# Patient Record
Sex: Female | Born: 1965
Health system: Southern US, Community
[De-identification: ages and names within clinical notes are randomized; demographics above are authoritative.]

## PROBLEM LIST (undated history)

## (undated) DIAGNOSIS — N6320 Unspecified lump in the left breast, unspecified quadrant: Secondary | ICD-10-CM

## (undated) DIAGNOSIS — I10 Essential (primary) hypertension: Secondary | ICD-10-CM

## (undated) DIAGNOSIS — F329 Major depressive disorder, single episode, unspecified: Secondary | ICD-10-CM

## (undated) DIAGNOSIS — M720 Palmar fascial fibromatosis [Dupuytren]: Secondary | ICD-10-CM

## (undated) DIAGNOSIS — N951 Menopausal and female climacteric states: Secondary | ICD-10-CM

## (undated) DIAGNOSIS — F32A Depression, unspecified: Secondary | ICD-10-CM

## (undated) DIAGNOSIS — F419 Anxiety disorder, unspecified: Secondary | ICD-10-CM

## (undated) DIAGNOSIS — D509 Iron deficiency anemia, unspecified: Secondary | ICD-10-CM

## (undated) HISTORY — DX: Menopausal and female climacteric states: N95.1

## (undated) HISTORY — PX: UPPER GASTROINTESTINAL ENDOSCOPY: SHX188

## (undated) HISTORY — DX: Depression, unspecified: F32.A

## (undated) HISTORY — DX: Anxiety disorder, unspecified: F41.9

## (undated) HISTORY — DX: Unspecified lump in the left breast, unspecified quadrant: N63.20

## (undated) HISTORY — DX: Essential (primary) hypertension: I10

## (undated) HISTORY — DX: Palmar fascial fibromatosis (dupuytren): M72.0

## (undated) HISTORY — PX: TUBAL LIGATION: SHX77

## (undated) HISTORY — PX: REFRACTIVE SURGERY: SHX103

## (undated) HISTORY — DX: Major depressive disorder, single episode, unspecified: F32.9

---
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
## 1995-05-16 HISTORY — PX: HAND TENDON SURGERY: SHX663

## 2006-05-15 HISTORY — PX: BREAST BIOPSY: SHX20

## 2013-05-15 HISTORY — PX: COLONOSCOPY: SHX174

## 2015-02-13 HISTORY — PX: HERNIA REPAIR: SHX51

## 2018-01-10 ENCOUNTER — Encounter: Payer: Self-pay | Admitting: Physician Assistant

## 2018-01-10 ENCOUNTER — Ambulatory Visit (INDEPENDENT_AMBULATORY_CARE_PROVIDER_SITE_OTHER): Payer: BLUE CROSS/BLUE SHIELD | Admitting: Physician Assistant

## 2018-01-10 VITALS — BP 135/79 | HR 84

## 2018-01-10 DIAGNOSIS — Z1211 Encounter for screening for malignant neoplasm of colon: Secondary | ICD-10-CM

## 2018-01-10 DIAGNOSIS — Z1231 Encounter for screening mammogram for malignant neoplasm of breast: Secondary | ICD-10-CM

## 2018-01-10 DIAGNOSIS — Z800 Family history of malignant neoplasm of digestive organs: Secondary | ICD-10-CM | POA: Diagnosis not present

## 2018-01-10 DIAGNOSIS — Z7689 Persons encountering health services in other specified circumstances: Secondary | ICD-10-CM

## 2018-01-10 DIAGNOSIS — F341 Dysthymic disorder: Secondary | ICD-10-CM

## 2018-01-10 DIAGNOSIS — M720 Palmar fascial fibromatosis [Dupuytren]: Secondary | ICD-10-CM | POA: Insufficient documentation

## 2018-01-10 DIAGNOSIS — Z23 Encounter for immunization: Secondary | ICD-10-CM

## 2018-01-10 MED ORDER — VENLAFAXINE HCL 75 MG PO TABS: 75.0000 mg | ORAL_TABLET | Freq: Three times a day (TID) | ORAL | 1 refills | Status: DC

## 2018-01-10 NOTE — Progress Notes (Signed)
HPI:                                                                Charlene Bell is a 52 y.o. female who presents to [facility]: Primary Care Sports Medicine today to establish care  Current concerns: medication refill  Recently relocated from [facility] Working for [employer].  Currently taking Effexor 75 mg tid since 2008 for depression. Reports she does have some cyclical mood changes and anxiety with her menses. Menses have become more irregular, Q2-39months, duration fluctuates between 1-5 days, right now cycle is heavier than normal    Depression screen [facility] 2/9 01/10/2018  Decreased Interest 2  Down, Depressed, Hopeless 2  PHQ - 2 Score 4  Altered sleeping 1  Tired, decreased energy 2  Change in appetite 0  Feeling bad or failure about yourself  2  Trouble concentrating 0  Moving slowly or fidgety/restless 0  Suicidal thoughts 0  PHQ-9 Score 9  Difficult doing work/chores Somewhat difficult    No flowsheet data found.    Past Medical History:  Diagnosis Date  . Anxiety   . Depression   . Dupuytren's contracture of right hand   . Left breast mass   . Perimenopause    Past Surgical History:  Procedure Laterality Date  . BREAST BIOPSY  2008  . COLONOSCOPY  2015   normal per patient  . HAND TENDON SURGERY Right 1997  . HERNIA REPAIR  02/2015  . TUBAL LIGATION     Social History   Tobacco Use  . Smoking status: Never Smoker  . Smokeless tobacco: Never Used  Substance Use Topics  . Alcohol use: Yes    Alcohol/week: 6.0 standard drinks    Types: 6 Standard drinks or equivalent per week   family history includes Colon cancer in her father; Dementia in her sister; Heart attack in her mother; Heart failure in her father.    ROS: Review of Systems  Constitutional: Positive for diaphoresis.  Genitourinary:       + abnormal periods  Psychiatric/Behavioral: Positive for depression.     Medications: Current Outpatient Medications   Medication Sig Dispense Refill  . venlafaxine (EFFEXOR) 75 MG tablet Take 1 tablet (75 mg total) by mouth 3 (three) times daily. 270 tablet 1   No current facility-administered medications for this visit.    Not on File     Objective:  BP 135/79   Pulse 84   LMP 01/06/2018 (Exact Date)  Gen:  alert, not ill-appearing, no distress, appropriate for age HEENT: head normocephalic without obvious abnormality, conjunctiva and cornea clear, trachea midline Pulm: Normal work of breathing, normal phonation Neuro: alert and oriented x 3, no tremor MSK: extremities atraumatic, normal gait and station; Contracture of right 5th digit Skin: intact, no rashes on exposed skin, no jaundice, no cyanosis Psych: well-groomed, cooperative, good eye contact, depressed mood, affect mood-congruent, speech is articulate, and thought processes clear and goal-directed    No results found for this or any previous visit (from the past 72 hour(s)). No results found.    Assessment and Plan: 52 y.o. female with   .Charlene Bell was seen today for establish care.  Diagnoses and all orders for this visit:  Encounter to establish care  Family  history of colon cancer in father -     Ambulatory referral to Gastroenterology  Colon cancer screening -     Ambulatory referral to Gastroenterology  Breast cancer screening by mammogram -     MM 3D SCREEN BREAST BILATERAL; Future  Needs flu shot -     Flu Vaccine QUAD 36+ mos IM  Dupuytren's contracture of right hand  Dysthymia  Other orders -     venlafaxine (EFFEXOR) 75 MG tablet; Take 1 tablet (75 mg total) by mouth 3 (three) times daily.   - Personally reviewed PMH, PSH, PFH, medications, allergies, HM - Age-appropriate cancer screening: Pap smear overdue, patient will schedule f/u; mammogram due, ordered today; colonoscopy Q5y, referral placed - Influenza given today - Tdap UTD per patient - Encouraged to schedule annual CPE w/fasting labs at earliest  convenience   Dysthymia - PHQ9=9, no acute safety issues - would like to continue Effexor at current dose - follow-up Q726months  Patient education and anticipatory guidance given Patient agrees with treatment plan Follow-up for CPE w/Pap and fasting labs or sooner as needed if symptoms worsen or fail to improve  Levonne Hubertharley E. Marieanne Marxen PA-C

## 2018-01-16 ENCOUNTER — Ambulatory Visit (INDEPENDENT_AMBULATORY_CARE_PROVIDER_SITE_OTHER): Payer: BLUE CROSS/BLUE SHIELD

## 2018-01-16 DIAGNOSIS — Z1231 Encounter for screening mammogram for malignant neoplasm of breast: Secondary | ICD-10-CM

## 2018-01-22 DIAGNOSIS — M79644 Pain in right finger(s): Secondary | ICD-10-CM | POA: Diagnosis not present

## 2018-01-22 DIAGNOSIS — M25641 Stiffness of right hand, not elsewhere classified: Secondary | ICD-10-CM | POA: Diagnosis not present

## 2018-01-23 ENCOUNTER — Encounter: Payer: BLUE CROSS/BLUE SHIELD | Admitting: Physician Assistant

## 2018-01-25 DIAGNOSIS — M25641 Stiffness of right hand, not elsewhere classified: Secondary | ICD-10-CM | POA: Diagnosis not present

## 2018-01-25 DIAGNOSIS — M79644 Pain in right finger(s): Secondary | ICD-10-CM | POA: Diagnosis not present

## 2018-01-28 ENCOUNTER — Telehealth: Payer: Self-pay | Admitting: Gastroenterology

## 2018-01-28 NOTE — Telephone Encounter (Signed)
Received referral for patient to have next colon here. Patient faxed colon reports from 2010 and 2015. Patient not requesting specific doctor. DOD for referral date 8.29.19 is Dr.Stark. Records sent to desk for review.

## 2018-01-29 ENCOUNTER — Other Ambulatory Visit (HOSPITAL_COMMUNITY)
Admission: RE | Admit: 2018-01-29 | Discharge: 2018-01-29 | Disposition: A | Discharge status: Home / Self Care | Payer: BLUE CROSS/BLUE SHIELD | Source: Ambulatory Visit | Attending: Physician Assistant | Admitting: Physician Assistant

## 2018-01-29 ENCOUNTER — Ambulatory Visit (INDEPENDENT_AMBULATORY_CARE_PROVIDER_SITE_OTHER): Payer: BLUE CROSS/BLUE SHIELD | Admitting: Physician Assistant

## 2018-01-29 ENCOUNTER — Encounter: Payer: Self-pay | Admitting: Physician Assistant

## 2018-01-29 VITALS — BP 138/86 | HR 82 | Resp 16 | Ht 63.0 in | Wt 141.0 lb

## 2018-01-29 DIAGNOSIS — Z0000 Encounter for general adult medical examination without abnormal findings: Secondary | ICD-10-CM

## 2018-01-29 DIAGNOSIS — Z124 Encounter for screening for malignant neoplasm of cervix: Secondary | ICD-10-CM | POA: Insufficient documentation

## 2018-01-29 DIAGNOSIS — N951 Menopausal and female climacteric states: Secondary | ICD-10-CM | POA: Diagnosis not present

## 2018-01-29 DIAGNOSIS — R030 Elevated blood-pressure reading, without diagnosis of hypertension: Secondary | ICD-10-CM

## 2018-01-29 DIAGNOSIS — M25641 Stiffness of right hand, not elsewhere classified: Secondary | ICD-10-CM | POA: Diagnosis not present

## 2018-01-29 DIAGNOSIS — Z13220 Encounter for screening for lipoid disorders: Secondary | ICD-10-CM

## 2018-01-29 DIAGNOSIS — M79644 Pain in right finger(s): Secondary | ICD-10-CM | POA: Diagnosis not present

## 2018-01-29 DIAGNOSIS — Z130 Encounter for screening for diseases of the blood and blood-forming organs and certain disorders involving the immune mechanism: Secondary | ICD-10-CM

## 2018-01-29 DIAGNOSIS — Z131 Encounter for screening for diabetes mellitus: Secondary | ICD-10-CM

## 2018-01-29 NOTE — Progress Notes (Signed)
HPI:                                                                Charlene Bell is a 52 y.o. female who presents to [facility]: Primary Care Sports Medicine today for annual physical exam  Current Concerns include: none    GYN/Sexual Health  Obstetrics: [id number]  Menstrual status: perimenopausal  LMP: 2 weeks ago  Menses: irregular  Last pap smear: 2015, normal per patient  History of abnormal pap smears: ASCUS more than 10 years ago  Sexually active: yes  Current contraception: none needed  History of STI: none    Office Visit from 01/10/2018 in [facility] [place]  PHQ-2 Total Score  4       Health Maintenance Health Maintenance  Topic Date Due  . HIV Screening  03/09/1981  . PAP SMEAR  03/09/1996  . COLONOSCOPY  03/16/2019  . MAMMOGRAM  01/17/2020  . TETANUS/TDAP  11/09/2024  . INFLUENZA VACCINE  Completed    Past Medical History:  Diagnosis Date  . Anxiety   . Depression   . Dupuytren's contracture of right hand   . Left breast mass   . Perimenopause    Past Surgical History:  Procedure Laterality Date  . BREAST BIOPSY Left 2008   all findings benign   . COLONOSCOPY  2015   normal per patient  . HAND TENDON SURGERY Right 1997  . HERNIA REPAIR  02/2015  . REFRACTIVE SURGERY    . TUBAL LIGATION     Social History   Tobacco Use  . Smoking status: Never Smoker  . Smokeless tobacco: Never Used  Substance Use Topics  . Alcohol use: Yes    Alcohol/week: 6.0 standard drinks    Types: 6 Standard drinks or equivalent per week   family history includes Colon cancer in her father; Dementia in her sister; Heart attack in her mother; Heart failure in her father.  ROS: negative except as noted in the HPI  Medications: Current Outpatient Medications  Medication Sig Dispense Refill  . Calcium Carbonate-Vitamin D3 600-400 MG-UNIT TABS Take 1 tablet by mouth 2 (two) times daily.    Marland Kitchen venlafaxine  (EFFEXOR) 75 MG tablet Take 1 tablet (75 mg total) by mouth 3 (three) times daily. 270 tablet 1   No current facility-administered medications for this visit.    Not on File     Objective:  BP 138/86   Pulse 82   Resp 16   Ht 5\' 3"  (1.6 m)   Wt 141 lb (64 kg)   LMP 01/06/2018 (Approximate)   BMI 24.98 kg/m  General Appearance:  Alert, cooperative, no distress, appropriate for age                            Head:  Normocephalic, without obvious abnormality                             Eyes:  PERRL, EOM's intact, conjunctiva and cornea clear  Ears:  TM pearly gray color and semitransparent, external ear canals normal, both ears                            Nose:  Nares symmetrical                          Throat:  Lips, tongue, and mucosa are moist, pink, and intact; good dentition                             Neck:  Supple; symmetrical, trachea midline, no adenopathy; thyroid: no enlargement, symmetric, no tenderness/mass/nodules; no carotid bruit                             Back:  Symmetrical, no curvature, ROM normal               Chest/Breast:  deferred                           Lungs:  Clear to auscultation bilaterally, respirations unlabored                             Heart:  regular rate & normal rhythm, S1 and S2 normal, no murmurs, rubs, or gallops                     Abdomen:  Soft, non-tender, no mass or organomegaly              Genitourinary:  vulva without rashes or lesions, normal introitus and urethral meatus, vaginal mucosa without erythema, normal discharge, cervix non-friable without lesions         Musculoskeletal:  Tone and strength strong and symmetrical, all extremities; no joint pain or edema, normal gait and station                                      Lymphatic:  No adenopathy             Skin/Hair/Nails:  Skin warm, dry and intact, no rashes or abnormal dyspigmentation on limited exam                   Neurologic:  Alert and oriented  x3, no cranial nerve deficits, sensation grossly intact, normal gait and station, no tremor Psych: well-groomed, cooperative, good eye contact, euthymic mood, affect mood-congruent, speech is articulate, and thought processes clear and goal-directed  A chaperone was used for the GU portion of the exam, Olivia MackieEvonia Henry, CMA.  No results found for this or any previous visit (from the past 72 hour(s)). No results found.    Assessment and Plan: 52 y.o. female with   .Charlene Bell was seen today for annual exam.  Diagnoses and all orders for this visit:  Encounter for annual physical exam -     CBC -     Comprehensive metabolic panel -     Lipid Panel w/reflex Direct LDL  Encounter for Papanicolaou smear for cervical cancer screening -     Cytology - PAP  Perimenopausal  Elevated blood pressure reading  Screening for blood disease -     CBC -  Comprehensive metabolic panel  Screening for diabetes mellitus -     Comprehensive metabolic panel  Screening for lipid disorders -     Lipid Panel w/reflex Direct LDL   - Personally reviewed PMH, PSH, PFH, medications, allergies, HM - Age-appropriate cancer screening: mammogram UTD, Pap collected today, Colonoscopy Q5Y, due 03/2019 - Influenza UTD - Tdap UTD - PHQ2 negative - Fasting labs pending - Declines STI screening  BP out of range on 2 checks in office today as well as prior office visit.  Patient to monitor and log BP's at home. Counseled on therapeutic lifestyle changes. Follow-up in 3 months for BP.   Patient education and anticipatory guidance given Patient agrees with treatment plan Follow-up based on Pap results or sooner as needed  Levonne Hubert PA-C

## 2018-01-29 NOTE — Patient Instructions (Addendum)
For your blood pressure: - Goal <130/80 - monitor and log blood pressures at home - check around the same time each day in a relaxed setting - Limit salt to <2000 mg/day - Follow DASH eating plan - limit alcohol to 2 standard drinks per day for men and 1 per day for women - avoid tobacco products - increase physical activity, ideally get 180 minutes of cardiovascular exercise per week - weight loss: 7% of current body weight - follow-up every 6 months for your blood pressure     Preventive Care 40-64 Years, Female Preventive care refers to lifestyle choices and visits with your health care provider that can promote health and wellness. What does preventive care include?  A yearly physical exam. This is also called an annual well check.  Dental exams once or twice a year.  Routine eye exams. Ask your health care provider how often you should have your eyes checked.  Personal lifestyle choices, including: ? Daily care of your teeth and gums. ? Regular physical activity. ? Eating a healthy diet. ? Avoiding tobacco and drug use. ? Limiting alcohol use. ? Practicing safe sex. ? Taking low-dose aspirin daily starting at age 73. ? Taking vitamin and mineral supplements as recommended by your health care provider. What happens during an annual well check? The services and screenings done by your health care provider during your annual well check will depend on your age, overall health, lifestyle risk factors, and family history of disease. Counseling Your health care provider may ask you questions about your:  Alcohol use.  Tobacco use.  Drug use.  Emotional well-being.  Home and relationship well-being.  Sexual activity.  Eating habits.  Work and work Statistician.  Method of birth control.  Menstrual cycle.  Pregnancy history.  Screening You may have the following tests or measurements:  Height, weight, and BMI.  Blood pressure.  Lipid and cholesterol  levels. These may be checked every 5 years, or more frequently if you are over 98 years old.  Skin check.  Lung cancer screening. You may have this screening every year starting at age 25 if you have a 30-pack-year history of smoking and currently smoke or have quit within the past 15 years.  Fecal occult blood test (FOBT) of the stool. You may have this test every year starting at age 42.  Flexible sigmoidoscopy or colonoscopy. You may have a sigmoidoscopy every 5 years or a colonoscopy every 10 years starting at age 67.  Hepatitis C blood test.  Hepatitis B blood test.  Sexually transmitted disease (STD) testing.  Diabetes screening. This is done by checking your blood sugar (glucose) after you have not eaten for a while (fasting). You may have this done every 1-3 years.  Mammogram. This may be done every 1-2 years. Talk to your health care provider about when you should start having regular mammograms. This may depend on whether you have a family history of breast cancer.  BRCA-related cancer screening. This may be done if you have a family history of breast, ovarian, tubal, or peritoneal cancers.  Pelvic exam and Pap test. This may be done every 3 years starting at age 73. Starting at age 1, this may be done every 5 years if you have a Pap test in combination with an HPV test.  Bone density scan. This is done to screen for osteoporosis. You may have this scan if you are at high risk for osteoporosis.  Discuss your test results, treatment options, and  if necessary, the need for more tests with your health care provider. Vaccines Your health care provider may recommend certain vaccines, such as:  Influenza vaccine. This is recommended every year.  Tetanus, diphtheria, and acellular pertussis (Tdap, Td) vaccine. You may need a Td booster every 10 years.  Varicella vaccine. You may need this if you have not been vaccinated.  Zoster vaccine. You may need this after age  15.  Measles, mumps, and rubella (MMR) vaccine. You may need at least one dose of MMR if you were born in 1957 or later. You may also need a second dose.  Pneumococcal 13-valent conjugate (PCV13) vaccine. You may need this if you have certain conditions and were not previously vaccinated.  Pneumococcal polysaccharide (PPSV23) vaccine. You may need one or two doses if you smoke cigarettes or if you have certain conditions.  Meningococcal vaccine. You may need this if you have certain conditions.  Hepatitis A vaccine. You may need this if you have certain conditions or if you travel or work in places where you may be exposed to hepatitis A.  Hepatitis B vaccine. You may need this if you have certain conditions or if you travel or work in places where you may be exposed to hepatitis B.  Haemophilus influenzae type b (Hib) vaccine. You may need this if you have certain conditions.  Talk to your health care provider about which screenings and vaccines you need and how often you need them. This information is not intended to replace advice given to you by your health care provider. Make sure you discuss any questions you have with your health care provider. Document Released: 05/28/2015 Document Revised: 01/19/2016 Document Reviewed: 03/02/2015 Elsevier Interactive Patient Education  [employer].

## 2018-01-29 NOTE — Telephone Encounter (Signed)
Dr.Stark reviewed records and states pt is on 5 year interval and should be due 11.2020. Recall put in system with Dr.Stark for November 2020 and patient notified of recommendations. Records placed in file folder for now.

## 2018-01-30 ENCOUNTER — Encounter: Payer: Self-pay | Admitting: Physician Assistant

## 2018-01-31 DIAGNOSIS — Z131 Encounter for screening for diabetes mellitus: Secondary | ICD-10-CM | POA: Diagnosis not present

## 2018-01-31 DIAGNOSIS — Z0000 Encounter for general adult medical examination without abnormal findings: Secondary | ICD-10-CM | POA: Diagnosis not present

## 2018-01-31 DIAGNOSIS — Z13220 Encounter for screening for lipoid disorders: Secondary | ICD-10-CM | POA: Diagnosis not present

## 2018-01-31 DIAGNOSIS — Z130 Encounter for screening for diseases of the blood and blood-forming organs and certain disorders involving the immune mechanism: Secondary | ICD-10-CM | POA: Diagnosis not present

## 2018-01-31 DIAGNOSIS — D509 Iron deficiency anemia, unspecified: Secondary | ICD-10-CM | POA: Diagnosis not present

## 2018-01-31 LAB — CBC
HCT: 29.9 % — ABNORMAL LOW (ref 35.0–45.0)
HEMOGLOBIN: 8.4 g/dL — AB (ref 11.7–15.5)
MCH: 20 pg — AB (ref 27.0–33.0)
MCHC: 28.1 g/dL — ABNORMAL LOW (ref 32.0–36.0)
MCV: 71 fL — AB (ref 80.0–100.0)
MPV: 10.9 fL (ref 7.5–12.5)
Platelets: 325 10*3/uL (ref 140–400)
RBC: 4.21 10*6/uL (ref 3.80–5.10)
RDW: 16 % — AB (ref 11.0–15.0)
WBC: 3.3 10*3/uL — ABNORMAL LOW (ref 3.8–10.8)

## 2018-01-31 LAB — CYTOLOGY - PAP
Diagnosis: NEGATIVE
HPV: NOT DETECTED

## 2018-02-01 ENCOUNTER — Encounter: Payer: Self-pay | Admitting: Physician Assistant

## 2018-02-01 ENCOUNTER — Other Ambulatory Visit: Payer: Self-pay | Admitting: Physician Assistant

## 2018-02-01 DIAGNOSIS — D509 Iron deficiency anemia, unspecified: Secondary | ICD-10-CM

## 2018-02-01 DIAGNOSIS — D72819 Decreased white blood cell count, unspecified: Secondary | ICD-10-CM | POA: Insufficient documentation

## 2018-02-01 DIAGNOSIS — E782 Mixed hyperlipidemia: Secondary | ICD-10-CM | POA: Insufficient documentation

## 2018-02-01 DIAGNOSIS — M79644 Pain in right finger(s): Secondary | ICD-10-CM | POA: Diagnosis not present

## 2018-02-01 DIAGNOSIS — M25641 Stiffness of right hand, not elsewhere classified: Secondary | ICD-10-CM | POA: Diagnosis not present

## 2018-02-01 DIAGNOSIS — E785 Hyperlipidemia, unspecified: Secondary | ICD-10-CM

## 2018-02-01 DIAGNOSIS — Z800 Family history of malignant neoplasm of digestive organs: Secondary | ICD-10-CM

## 2018-02-01 HISTORY — DX: Iron deficiency anemia, unspecified: D50.9

## 2018-02-01 LAB — COMPREHENSIVE METABOLIC PANEL
AG Ratio: 1.7 (calc) (ref 1.0–2.5)
ALT: 17 U/L (ref 6–29)
AST: 17 U/L (ref 10–35)
Albumin: 4.3 g/dL (ref 3.6–5.1)
Alkaline phosphatase (APISO): 73 U/L (ref 33–130)
BILIRUBIN TOTAL: 0.3 mg/dL (ref 0.2–1.2)
BUN: 14 mg/dL (ref 7–25)
CALCIUM: 8.7 mg/dL (ref 8.6–10.4)
CO2: 25 mmol/L (ref 20–32)
Chloride: 108 mmol/L (ref 98–110)
Creat: 0.76 mg/dL (ref 0.50–1.05)
Globulin: 2.5 g/dL (calc) (ref 1.9–3.7)
Glucose, Bld: 86 mg/dL (ref 65–99)
Potassium: 4.8 mmol/L (ref 3.5–5.3)
SODIUM: 140 mmol/L (ref 135–146)
TOTAL PROTEIN: 6.8 g/dL (ref 6.1–8.1)

## 2018-02-01 LAB — TEST AUTHORIZATION

## 2018-02-01 LAB — CBC WITH DIFFERENTIAL/PLATELET
BASOS ABS: 50 {cells}/uL (ref 0–200)
Basophils Relative: 1.5 %
EOS PCT: 18.9 %
Eosinophils Absolute: 624 cells/uL — ABNORMAL HIGH (ref 15–500)
HEMATOCRIT: 29.9 % — AB (ref 35.0–45.0)
Hemoglobin: 8.4 g/dL — ABNORMAL LOW (ref 11.7–15.5)
Lymphs Abs: 848 cells/uL — ABNORMAL LOW (ref 850–3900)
MCH: 20 pg — ABNORMAL LOW (ref 27.0–33.0)
MCHC: 28.1 g/dL — ABNORMAL LOW (ref 32.0–36.0)
MCV: 71 fL — AB (ref 80.0–100.0)
MPV: 10.9 fL (ref 7.5–12.5)
Monocytes Relative: 9.6 %
NEUTROS PCT: 44.3 %
Neutro Abs: 1462 cells/uL — ABNORMAL LOW (ref 1500–7800)
PLATELETS: 325 10*3/uL (ref 140–400)
RBC: 4.21 10*6/uL (ref 3.80–5.10)
RDW: 16 % — AB (ref 11.0–15.0)
TOTAL LYMPHOCYTE: 25.7 %
WBC mixed population: 317 cells/uL (ref 200–950)
WBC: 3.3 10*3/uL — ABNORMAL LOW (ref 3.8–10.8)

## 2018-02-01 LAB — LIPID PANEL W/REFLEX DIRECT LDL
CHOLESTEROL: 226 mg/dL — AB (ref <200)
HDL: 76 mg/dL (ref >50)
LDL CHOLESTEROL (CALC): 135 mg/dL — AB
Non-HDL Cholesterol (Calc): 150 mg/dL (calc) — ABNORMAL HIGH (ref <130)
Total CHOL/HDL Ratio: 3 (calc) (ref <5.0)
Triglycerides: 54 mg/dL (ref <150)

## 2018-02-01 LAB — IRON,TIBC AND FERRITIN PANEL
%SAT: 3 % (calc) — ABNORMAL LOW (ref 16–45)
FERRITIN: 5 ng/mL — AB (ref 16–232)
IRON: 11 ug/dL — AB (ref 45–160)
TIBC: 415 ug/dL (ref 250–450)

## 2018-02-01 LAB — RETICULOCYTES
ABS Retic: 34240 cells/uL (ref 20000–8000)
RETIC CT PCT: 0.8 %

## 2018-02-05 ENCOUNTER — Encounter: Payer: Self-pay | Admitting: Physician Assistant

## 2018-02-11 ENCOUNTER — Encounter: Payer: Self-pay | Admitting: Physician Assistant

## 2018-02-12 ENCOUNTER — Encounter: Payer: Self-pay | Admitting: Physician Assistant

## 2018-02-12 ENCOUNTER — Other Ambulatory Visit: Payer: Self-pay | Admitting: Physician Assistant

## 2018-02-12 DIAGNOSIS — M25641 Stiffness of right hand, not elsewhere classified: Secondary | ICD-10-CM | POA: Diagnosis not present

## 2018-02-12 DIAGNOSIS — D509 Iron deficiency anemia, unspecified: Secondary | ICD-10-CM

## 2018-02-12 DIAGNOSIS — M79644 Pain in right finger(s): Secondary | ICD-10-CM | POA: Diagnosis not present

## 2018-02-12 MED ORDER — FERROUS SULFATE 325 (65 FE) MG PO TBEC: DELAYED_RELEASE_TABLET | ORAL | 11 refills | Status: DC

## 2018-02-15 DIAGNOSIS — M79644 Pain in right finger(s): Secondary | ICD-10-CM | POA: Diagnosis not present

## 2018-02-15 DIAGNOSIS — M25641 Stiffness of right hand, not elsewhere classified: Secondary | ICD-10-CM | POA: Diagnosis not present

## 2018-02-22 DIAGNOSIS — M79644 Pain in right finger(s): Secondary | ICD-10-CM | POA: Diagnosis not present

## 2018-02-22 DIAGNOSIS — M25641 Stiffness of right hand, not elsewhere classified: Secondary | ICD-10-CM | POA: Diagnosis not present

## 2018-02-26 DIAGNOSIS — M79644 Pain in right finger(s): Secondary | ICD-10-CM | POA: Diagnosis not present

## 2018-02-26 DIAGNOSIS — M720 Palmar fascial fibromatosis [Dupuytren]: Secondary | ICD-10-CM | POA: Diagnosis not present

## 2018-02-26 DIAGNOSIS — M25641 Stiffness of right hand, not elsewhere classified: Secondary | ICD-10-CM | POA: Diagnosis not present

## 2018-02-26 DIAGNOSIS — M20021 Boutonniere deformity of right finger(s): Secondary | ICD-10-CM | POA: Diagnosis not present

## 2018-03-01 DIAGNOSIS — M25641 Stiffness of right hand, not elsewhere classified: Secondary | ICD-10-CM | POA: Diagnosis not present

## 2018-03-01 DIAGNOSIS — M79644 Pain in right finger(s): Secondary | ICD-10-CM | POA: Diagnosis not present

## 2018-03-08 ENCOUNTER — Encounter: Payer: Self-pay | Admitting: Physician Assistant

## 2018-03-08 DIAGNOSIS — M79644 Pain in right finger(s): Secondary | ICD-10-CM | POA: Diagnosis not present

## 2018-03-08 DIAGNOSIS — M25641 Stiffness of right hand, not elsewhere classified: Secondary | ICD-10-CM | POA: Diagnosis not present

## 2018-03-12 DIAGNOSIS — M25641 Stiffness of right hand, not elsewhere classified: Secondary | ICD-10-CM | POA: Diagnosis not present

## 2018-03-12 DIAGNOSIS — M79644 Pain in right finger(s): Secondary | ICD-10-CM | POA: Diagnosis not present

## 2018-03-14 ENCOUNTER — Other Ambulatory Visit (INDEPENDENT_AMBULATORY_CARE_PROVIDER_SITE_OTHER): Payer: BLUE CROSS/BLUE SHIELD

## 2018-03-14 DIAGNOSIS — Z1211 Encounter for screening for malignant neoplasm of colon: Secondary | ICD-10-CM | POA: Diagnosis not present

## 2018-03-14 LAB — HEMOCCULT GUIAC POC 1CARD (OFFICE)
Card #2 Fecal Occult Blod, POC: NEGATIVE
FECAL OCCULT BLD: NEGATIVE
FECAL OCCULT BLD: NEGATIVE

## 2018-03-14 NOTE — Addendum Note (Signed)
Addended by: Thom Chimes on: 03/14/2018 09:06 AM   Modules accepted: Orders

## 2018-03-15 DIAGNOSIS — M25641 Stiffness of right hand, not elsewhere classified: Secondary | ICD-10-CM | POA: Diagnosis not present

## 2018-03-15 DIAGNOSIS — M79644 Pain in right finger(s): Secondary | ICD-10-CM | POA: Diagnosis not present

## 2018-03-19 DIAGNOSIS — M25641 Stiffness of right hand, not elsewhere classified: Secondary | ICD-10-CM | POA: Diagnosis not present

## 2018-03-26 DIAGNOSIS — M25641 Stiffness of right hand, not elsewhere classified: Secondary | ICD-10-CM | POA: Diagnosis not present

## 2018-03-26 DIAGNOSIS — M79644 Pain in right finger(s): Secondary | ICD-10-CM | POA: Diagnosis not present

## 2018-04-02 DIAGNOSIS — M79644 Pain in right finger(s): Secondary | ICD-10-CM | POA: Diagnosis not present

## 2018-04-02 DIAGNOSIS — M25641 Stiffness of right hand, not elsewhere classified: Secondary | ICD-10-CM | POA: Diagnosis not present

## 2018-04-15 ENCOUNTER — Ambulatory Visit (INDEPENDENT_AMBULATORY_CARE_PROVIDER_SITE_OTHER): Payer: BLUE CROSS/BLUE SHIELD | Admitting: Physician Assistant

## 2018-04-15 ENCOUNTER — Encounter: Payer: Self-pay | Admitting: Physician Assistant

## 2018-04-15 VITALS — BP 142/82 | HR 65 | Wt 135.0 lb

## 2018-04-15 DIAGNOSIS — I10 Essential (primary) hypertension: Secondary | ICD-10-CM | POA: Diagnosis not present

## 2018-04-15 DIAGNOSIS — G43009 Migraine without aura, not intractable, without status migrainosus: Secondary | ICD-10-CM

## 2018-04-15 DIAGNOSIS — D509 Iron deficiency anemia, unspecified: Secondary | ICD-10-CM | POA: Diagnosis not present

## 2018-04-15 DIAGNOSIS — D709 Neutropenia, unspecified: Secondary | ICD-10-CM | POA: Diagnosis not present

## 2018-04-15 HISTORY — DX: Iron deficiency anemia, unspecified: D50.9

## 2018-04-15 MED ORDER — VERAPAMIL HCL ER 120 MG PO TBCR: 120.0000 mg | EXTENDED_RELEASE_TABLET | Freq: Every day | ORAL | 0 refills | Status: DC

## 2018-04-15 MED ORDER — SUMATRIPTAN SUCCINATE 50 MG PO TABS: 50.0000 mg | ORAL_TABLET | Freq: Once | ORAL | 2 refills | Status: DC | PRN

## 2018-04-15 MED ORDER — NAPROXEN 500 MG PO TABS: 500.0000 mg | ORAL_TABLET | Freq: Two times a day (BID) | ORAL | 3 refills | Status: AC | PRN

## 2018-04-15 NOTE — Progress Notes (Signed)
HPI:                                                                Charlene Bell is a 52 y.o. female who presents to [facility] Charlene Bell: Primary Care Sports Medicine today for HTN f/u  Iron deficiency anemia: She has been taking ferrous sulfate 3 times daily every other day for the last 3 months.  She continues to endorse some mild fatigue, though this is not interfering with aerobic exercise or daily activities.  She denies palpitations or dyspnea on exertion.  She denies any melena, hematochezia, abnormal bruising or bleeding.  She is no longer having regular menstrual periods, she is perimenopausal and having intermittent spotting every few months.  She had a negative fecal occult blood test 2 months ago.  At this point it is not clear the etiology for her iron deficiency.  HTN: has been managing with lifestyle changes.  Reports she is exercising daily for 40 minutes, various strength and aerobic/plyometric workouts. She stopped drinking alcohol. Checks BP's at home. BP range 123-146/80-93. Denies vision change, headache, chest pain with exertion, orthopnea, lightheadedness, syncope and edema. Risk factors include: family hx  Headaches: reports bilateral occipital pain approx 2 days per week. Describes brief, sharp pains lasting 1 minute. Also has intermittent unilateral temporal headaches lasting hours to a full day. Pain is described as pressure. Associated phonophobia. Denies photophobia, diplopia, visual disturbance, dizziness, nausea/vomiting, focal weakness.  Relieved moderately with Ibuprofen.  In a typical week she will have 4 headache days.   Depression/Anxiety: taking Effexor 75 mg tid without difficulty. Reports feeling more depressed this time of year. Endorses some anhedonia, difficulty getting up some days, but is still functioning.  She recently gave up drinking alcohol altogether in September.  Reports that she thinks she was a "high functioning alcoholic."  She denies any  history of DUI or legal difficulties, but family members have told her that she should cut back.  She previously was drinking 2-3 alcoholic beverages daily.  She admits that quitting cold [place] was challenging and she has cravings some days.  Denies symptoms of mania/hypomania. Denies suicidal thinking. Denies auditory/visual hallucinations.  Depression screen [facility] 2/9 04/15/2018 01/10/2018  Decreased Interest 1 2  Down, Depressed, Hopeless 1 2  PHQ - 2 Score 2 4  Altered sleeping 2 1  Tired, decreased energy 1 2  Change in appetite 0 0  Feeling bad or failure about yourself  1 2  Trouble concentrating 0 0  Moving slowly or fidgety/restless 0 0  Suicidal thoughts 0 0  PHQ-9 Score 6 9  Difficult doing work/chores Not difficult at all Somewhat difficult    GAD 7 : Generalized Anxiety Score 04/15/2018  Nervous, Anxious, on Edge 2  Control/stop worrying 0  Worry too much - different things 0  Trouble relaxing 0  Restless 0  Easily annoyed or irritable 2  Afraid - awful might happen 0  Total GAD 7 Score 4      Past Medical History:  Diagnosis Date  . Anxiety   . Depression   . Dupuytren's contracture of right hand   . Left breast mass   . Perimenopause    Past Surgical History:  Procedure Laterality Date  . BREAST BIOPSY Left 2008  all findings benign   . COLONOSCOPY  2015   normal per patient  . HAND TENDON SURGERY Right 1997  . HERNIA REPAIR  02/2015  . REFRACTIVE SURGERY    . TUBAL LIGATION     Social History   Tobacco Use  . Smoking status: Never Smoker  . Smokeless tobacco: Never Used  Substance Use Topics  . Alcohol use: Not Currently    Alcohol/week: 14.0 - 21.0 standard drinks    Types: 14 - 21 Standard drinks or equivalent per week    Comment: last drink 02/03/18   family history includes Colon cancer in her father; Dementia in her sister; Heart attack in her mother; Heart failure in her father.    ROS: negative except as noted in the  HPI  Medications: Current Outpatient Medications  Medication Sig Dispense Refill  . Calcium Carbonate-Vitamin D3 600-400 MG-UNIT TABS Take 1 tablet by mouth 2 (two) times daily.    Marland Kitchen. docusate sodium (COLACE) 100 MG capsule Take 100 mg by mouth 2 (two) times daily as needed for constipation.    . ferrous sulfate 325 (65 FE) MG EC tablet 1 tab PO tid every other day 90 tablet 11  . venlafaxine (EFFEXOR) 75 MG tablet Take 1 tablet (75 mg total) by mouth 3 (three) times daily. 270 tablet 1   No current facility-administered medications for this visit.    Not on File     Objective:  BP (!) 142/82   Pulse 65   Wt 135 lb (61.2 kg)   LMP 02/12/2018   BMI 23.91 kg/m  Gen:  alert, not ill-appearing, no distress, appropriate for age HEENT: head normocephalic without obvious abnormality, conjunctiva and cornea clear, trachea midline Pulm: Normal work of breathing, normal phonation, clear to auscultation bilaterally, no wheezes, rales or rhonchi CV: Normal rate, regular rhythm, s1 and s2 distinct, no murmurs, clicks or rubs  Neuro: alert and oriented x 3, no tremor MSK: extremities atraumatic, normal gait and station Skin: intact, no rashes on exposed skin, no jaundice, no cyanosis Psych: well-groomed, cooperative, good eye contact, euthymic mood, affect mood-congruent, speech is articulate, and thought processes clear and goal-directed  Lab Results  Component Value Date   CREATININE 0.76 01/31/2018   BUN 14 01/31/2018   NA 140 01/31/2018   K 4.8 01/31/2018   CL 108 01/31/2018   CO2 25 01/31/2018   Lab Results  Component Value Date   WBC 3.3 (L) 01/31/2018   WBC 3.3 (L) 01/31/2018   HGB 8.4 (L) 01/31/2018   HGB 8.4 (L) 01/31/2018   HCT 29.9 (L) 01/31/2018   HCT 29.9 (L) 01/31/2018   MCV 71.0 (L) 01/31/2018   MCV 71.0 (L) 01/31/2018   PLT 325 01/31/2018   PLT 325 01/31/2018   Lab Results  Component Value Date   IRON 11 (L) 01/31/2018   TIBC 415 01/31/2018   FERRITIN 5 (L)  01/31/2018   Lab Results  Component Value Date   RETICCTPCT 0.8 01/31/2018    Lab Results  Component Value Date   CHOL 226 (H) 01/31/2018   HDL 76 01/31/2018   LDLCALC 135 (H) 01/31/2018   TRIG 54 01/31/2018   CHOLHDL 3.0 01/31/2018     The 10-year ASCVD risk score Denman George(Goff DC Jr., et al., 2013) is: 1.5%   Values used to calculate the score:     Age: 89 years     Sex: Female     Is Non-Hispanic African American: No  Diabetic: No     Tobacco smoker: No     Systolic Blood Pressure: 142 mmHg     Is BP treated: No     HDL Cholesterol: 76 mg/dL     Total Cholesterol: 226 mg/dL   Assessment and Plan: 52 y.o. female with   .Charlene Bell was seen today for hypertension.  Diagnoses and all orders for this visit:  Iron deficiency anemia, unspecified iron deficiency anemia type -     CBC with Differential/Platelet -     Fe+TIBC+Fer -     Pathologist smear review  Neutropenia, unspecified type (HCC) -     CBC with Differential/Platelet -     Pathologist smear review  Hypertension goal BP (blood pressure) < 130/80  Migraine without aura and without status migrainosus, not intractable -     SUMAtriptan (IMITREX) 50 MG tablet; Take 1-2 tablets (50-100 mg total) by mouth once as needed for up to 1 dose for migraine. Repeat once in 2 hours if needed -     naproxen (NAPROSYN) 500 MG tablet; Take 1 tablet (500 mg total) by mouth every 12 (twelve) hours as needed for headache.  Other orders -     verapamil (CALAN-SR) 120 MG CR tablet; Take 1 tablet (120 mg total) by mouth at bedtime.   IDA: Last Hgb 8.4. Reticulocytes were increased.  Both neutrophils and lymphocytes were also mildly decreased.  Normal platelets.  FOBT negative.  Unclear etiology.  She has been taking ferrous sulfate tid every other day. Recheck CBC, iron panel and peirpheral smear today.  We discussed the possibility of a referral to hematology pending her repeat labs today  HTN: - home and office BP's in stage 1  hypertensive range - 10 yr ASCVD risk 1.5% - starting Verapamil (co-morbid migraines)  Headaches: Likely a combination of tension and migraine headaches.  Given that she is having 4 more headache days per week, and to prevent medication overuse headache, would recommend headache prophylaxis.  Verapamil may be a good option due to her comorbid hypertension.  We will start with sustained-release tablet 120 mg at bedtime. Imitrex and naprosyn prn for abortive therapy. Counseled to limit use of abortive agents to <10 days per month. Counseled on general headache measures/lifestyle.  Instructed to keep a headache diary and follow-up in 3 months.  Depression: no acute safety issues.  Applauded her for abstaining from alcohol. She would like to keep medication regimen the same for now and reassess things after the holidays.  Patient education and anticipatory guidance given Patient agrees with treatment plan Follow-up in 3 months for HTN/migraines/depression or sooner as needed if symptoms worsen or fail to improve  Levonne Hubert PA-C

## 2018-04-15 NOTE — Patient Instructions (Addendum)
For your blood pressure: - Goal <130/80 (Ideally 120's/70's) - take baby aspirin 81 mg daily to help prevent heart attack/stroke - monitor and log blood pressures at home - check around the same time each day in a relaxed setting - Limit salt to <2500 mg/day - Follow DASH (Dietary Approach to Stopping Hypertension) eating plan - Try to get at least 150 minutes of aerobic exercise per week - Aim to go on a brisk walk 30 minutes per day at least 5 days per week. If you're not active, gradually increase how long you walk by 5 minutes each week - limit alcohol: 2 standard drinks per day for men and 1 per day for women - avoid tobacco/nicotine products. Consider smoking cessation if you smoke - weight loss: 7% of current body weight can reduce your blood pressure by 5-10 points - follow-up at least every 6 months for your blood pressure.  - Follow-up sooner if your BP is not controlled  For your migraines: - headache diary (paper or app like MigraineBuddy, iHeadache) - start Verapamil for prophylaxis - optional B2 (riboflavin) and Magnesium for prophylaxis - take Imitrex at the first sign of migraine for abortive therapy - may repeat once after 2 hours if headache persists - do not use more than twice in 24 hours. And try not to use abortive medication more than 6 times per month - work on sleep hygiene - drink 11 cups of water per day - reduce caffeine intake - avoid alcohol - don't skip meals   Iron Deficiency Anemia, Adult Iron deficiency anemia is a condition in which the concentration of red blood cells or hemoglobin in the blood is below normal because of too little iron. Hemoglobin is a substance in red blood cells that carries oxygen to the body's tissues. When the concentration of red blood cells or hemoglobin is too low, not enough oxygen reaches these tissues. Iron deficiency anemia is usually long-lasting (chronic) and it develops over time. It may or may not cause symptoms. It  is a common type of anemia. What are the causes? This condition may be caused by:  Not enough iron in the diet.  Blood loss caused by bleeding in the intestine.  Blood loss from a gastrointestinal condition like Crohn disease.  Frequent blood draws, such as from blood donation.  Abnormal absorption in the gut.  Heavy menstrual periods in women.  Cancers of the gastrointestinal system, such as colon cancer.  What are the signs or symptoms? Symptoms of this condition may include:  Fatigue.  Headache.  Pale skin, lips, and nail beds.  Poor appetite.  Weakness.  Shortness of breath.  Dizziness.  Cold hands and feet.  Fast or irregular heartbeat.  Irritability. This is more common in severe anemia.  Rapid breathing. This is more common in severe anemia.  Mild anemia may not cause any symptoms. How is this diagnosed? This condition is diagnosed based on:  Your medical history.  A physical exam.  Blood tests.  You may have additional tests to find the underlying cause of your anemia, such as:  Testing for blood in the stool (fecal occult blood test).  A procedure to see inside your colon and rectum (colonoscopy).  A procedure to see inside your esophagus and stomach (endoscopy).  A test in which cells are removed from bone marrow (bone marrow aspiration) or fluid is removed from the bone marrow to be examined (biopsy). This is rarely needed.  How is this treated? This condition  is treated by correcting the cause of your iron deficiency. Treatment may involve:  Adding iron-rich foods to your diet.  Taking iron supplements. If you are pregnant or breastfeeding, you may need to take extra iron because your normal diet usually does not provide the amount of iron that you need.  Increasing vitamin C intake. Vitamin C helps your body absorb iron. Your health care provider may recommend that you take iron supplements along with a glass of orange juice or a  vitamin C supplement.  Medicines to make heavy menstrual flow lighter.  Surgery.  You may need repeat blood tests to determine whether treatment is working. Depending on the underlying cause, the anemia should be corrected within 2 months of starting treatment. If the treatment does not seem to be working, you may need more testing. Follow these instructions at home: Medicines  Take over-the-counter and prescription medicines only as told by your health care provider. This includes iron supplements and vitamins.  If you cannot tolerate taking iron supplements by mouth, talk with your health care provider about taking them through a vein (intravenously) or an injection into a muscle.  For the best iron absorption, you should take iron supplements when your stomach is empty. If you cannot tolerate them on an empty stomach, you may need to take them with food.  Do not drink milk or take antacids at the same time as your iron supplements. Milk and antacids may interfere with iron absorption.  Iron supplements can cause constipation. To prevent constipation, include fiber in your diet as told by your health care provider. A stool softener may also be recommended. Eating and drinking  Talk with your health care provider before changing your diet. He or she may recommend that you eat foods that contain a lot of iron, such as: ? Liver. ? Low-fat (lean) beef. ? Breads and cereals that have iron added to them (are fortified). ? Eggs. ? Dried fruit. ? Dark Mol, leafy vegetables.  To help your body use the iron from iron-rich foods, eat those foods at the same time as fresh fruits and vegetables that are high in vitamin C. Foods that are high in vitamin C include: ? Oranges. ? Peppers. ? Tomatoes. ? Mangoes.  Drinkenoughfluid to keep your urine clear or pale yellow. General instructions  Return to your normal activities as told by your health care provider. Ask your health care provider  what activities are safe for you.  Practice good hygiene. Anemia can make you more prone to illness and infection.  Keep all follow-up visits as told by your health care provider. This is important. Contact a health care provider if:  You feel nauseous or you vomit.  You feel weak.  You have unexplained sweating.  You develop symptoms of constipation, such as: ? Having fewer than three bowel movements a week. ? Straining to have a bowel movement. ? Having stools that are hard, dry, or larger than normal. ? Feeling full or bloated. ? Pain in the lower abdomen. ? Not feeling relief after having a bowel movement. Get help right away if:  You faint. If this happens, do not drive yourself to the hospital. Call your local emergency services (911 in the [place]  You have chest pain.  You have shortness of breath that: ? Is severe. ? Gets worse with physical activity.  You have a rapid heartbeat.  You become light-headed when getting up from a sitting or lying down position. This information is not  intended to replace advice given to you by your health care provider. Make sure you discuss any questions you have with your health care provider. Document Released: 04/28/2000 Document Revised: 01/19/2016 Document Reviewed: 01/19/2016 Elsevier Interactive Patient Education  2018 [employer].

## 2018-04-16 LAB — CBC WITH DIFFERENTIAL/PLATELET
BASOS PCT: 1 %
Basophils Absolute: 49 cells/uL (ref 0–200)
EOS PCT: 2.2 %
Eosinophils Absolute: 108 cells/uL (ref 15–500)
HEMATOCRIT: 40.9 % (ref 35.0–45.0)
Hemoglobin: 12.7 g/dL (ref 11.7–15.5)
LYMPHS ABS: 1480 {cells}/uL (ref 850–3900)
MCH: 25.3 pg — ABNORMAL LOW (ref 27.0–33.0)
MCHC: 31.1 g/dL — ABNORMAL LOW (ref 32.0–36.0)
MCV: 81.6 fL (ref 80.0–100.0)
MONOS PCT: 7.7 %
MPV: 12 fL (ref 7.5–12.5)
NEUTROS ABS: 2886 {cells}/uL (ref 1500–7800)
Neutrophils Relative %: 58.9 %
Platelets: 329 10*3/uL (ref 140–400)
RBC: 5.01 10*6/uL (ref 3.80–5.10)
RDW: 21.1 % — ABNORMAL HIGH (ref 11.0–15.0)
Total Lymphocyte: 30.2 %
WBC mixed population: 377 cells/uL (ref 200–950)
WBC: 4.9 10*3/uL (ref 3.8–10.8)

## 2018-04-16 LAB — PATHOLOGIST SMEAR REVIEW

## 2018-04-16 LAB — IRON,TIBC AND FERRITIN PANEL
%SAT: 34 % (ref 16–45)
FERRITIN: 15 ng/mL — AB (ref 16–232)
Iron: 123 ug/dL (ref 45–160)
TIBC: 362 ug/dL (ref 250–450)

## 2018-04-16 NOTE — Progress Notes (Signed)
Good news, all of your blood counts have improved. Your hemoglobin is no longer in an anemic range Your iron levels have normalized Your iron stores are better but still low.  I think we can reduce the dosing of your iron supplementation to once every other day, but I would continue iron supplementation indefinitely for now

## 2018-04-30 DIAGNOSIS — M20021 Boutonniere deformity of right finger(s): Secondary | ICD-10-CM | POA: Diagnosis not present

## 2018-05-01 DIAGNOSIS — M79644 Pain in right finger(s): Secondary | ICD-10-CM | POA: Diagnosis not present

## 2018-05-01 DIAGNOSIS — M25641 Stiffness of right hand, not elsewhere classified: Secondary | ICD-10-CM | POA: Diagnosis not present

## 2018-06-13 DIAGNOSIS — Z713 Dietary counseling and surveillance: Secondary | ICD-10-CM | POA: Diagnosis not present

## 2018-07-08 DIAGNOSIS — Z713 Dietary counseling and surveillance: Secondary | ICD-10-CM | POA: Diagnosis not present

## 2018-07-10 ENCOUNTER — Other Ambulatory Visit: Payer: Self-pay | Admitting: Physician Assistant

## 2018-07-30 ENCOUNTER — Ambulatory Visit: Payer: BLUE CROSS/BLUE SHIELD | Admitting: Physician Assistant

## 2018-09-24 DIAGNOSIS — Z713 Dietary counseling and surveillance: Secondary | ICD-10-CM | POA: Diagnosis not present

## 2018-09-27 ENCOUNTER — Other Ambulatory Visit: Payer: Self-pay | Admitting: Physician Assistant

## 2018-09-27 DIAGNOSIS — F341 Dysthymic disorder: Secondary | ICD-10-CM

## 2018-10-02 ENCOUNTER — Ambulatory Visit (INDEPENDENT_AMBULATORY_CARE_PROVIDER_SITE_OTHER): Payer: BLUE CROSS/BLUE SHIELD | Admitting: Physician Assistant

## 2018-10-02 ENCOUNTER — Encounter: Payer: Self-pay | Admitting: Physician Assistant

## 2018-10-02 VITALS — BP 119/74 | HR 85 | Wt 131.0 lb

## 2018-10-02 DIAGNOSIS — I10 Essential (primary) hypertension: Secondary | ICD-10-CM

## 2018-10-02 DIAGNOSIS — G43009 Migraine without aura, not intractable, without status migrainosus: Secondary | ICD-10-CM

## 2018-10-02 DIAGNOSIS — F419 Anxiety disorder, unspecified: Secondary | ICD-10-CM

## 2018-10-02 DIAGNOSIS — E785 Hyperlipidemia, unspecified: Secondary | ICD-10-CM | POA: Diagnosis not present

## 2018-10-02 DIAGNOSIS — Z8249 Family history of ischemic heart disease and other diseases of the circulatory system: Secondary | ICD-10-CM

## 2018-10-02 DIAGNOSIS — R790 Abnormal level of blood mineral: Secondary | ICD-10-CM

## 2018-10-02 DIAGNOSIS — Z5181 Encounter for therapeutic drug level monitoring: Secondary | ICD-10-CM

## 2018-10-02 DIAGNOSIS — Z8639 Personal history of other endocrine, nutritional and metabolic disease: Secondary | ICD-10-CM

## 2018-10-02 DIAGNOSIS — F341 Dysthymic disorder: Secondary | ICD-10-CM

## 2018-10-02 MED ORDER — BUSPIRONE HCL 7.5 MG PO TABS: 7.5000 mg | ORAL_TABLET | Freq: Two times a day (BID) | ORAL | 0 refills | Status: DC

## 2018-10-02 MED ORDER — VENLAFAXINE HCL 75 MG PO TABS: 75.0000 mg | ORAL_TABLET | Freq: Three times a day (TID) | ORAL | 1 refills | Status: DC

## 2018-10-02 MED ORDER — VERAPAMIL HCL ER 120 MG PO TBCR: 120.0000 mg | EXTENDED_RELEASE_TABLET | Freq: Every day | ORAL | 2 refills | Status: DC

## 2018-10-02 NOTE — Progress Notes (Signed)
Virtual Visit via Video Note  I connected with Charlene Bell on 10/14/18 at  9:10 AM EDT by a video enabled telemedicine application and verified that I am speaking with the correct person using two identifiers.   I discussed the limitations of evaluation and management by telemedicine and the availability of in person appointments. The patient expressed understanding and agreed to proceed.  History of Present Illness: HPI:                                                                Charlene Bell is a 53 y.o. female   CC: medication management  IDA: iron supplementation reduced in December to every other day due to normal Hgb and iron levels. Ferritin was still depleted at 15, but improved from 5. Her hemmocult was also negative at that time. She denies palpitations or dyspnea on exertion.  She denies any melena, hematochezia, abnormal bruising or bleeding.  She is no longer having regular menstrual periods, she is perimenopausal and having intermittent spotting every few months.   HTN: taking Verpamil daily. Compliant with medications. Denies vision change, headache, chest pain with exertion, orthopnea, lightheadedness, syncope and edema. Risk factors include: family hx   Anxiety - "on edge," "higher than it has been," she attributes part of this to the COVID-19 pandemic Sleep - "very good" no sleep aids Alcohol: she has been sober since 02/04/18. She has had increased cravings, but she feels very supported by her family and she states that she thinks of her children and the craving goes away  Headaches - increased frequency over the last 2 months that she attributed to eye strain working with monitors all day. She is taking Verapamil and Effexor for comorbid HTN and anxiety. reports bilateral occipital pain approx 2 days per week. Describes brief, sharp pains lasting 1 minute. Also has intermittent unilateral temporal headaches lasting hours to a full day. Pain is described as pressure.  Associated phonophobia. Denies photophobia, diplopia, visual disturbance, dizziness, nausea/vomiting, focal weakness.  Relieved moderately with Ibuprofen.  In a typical week she will have 4 headache days.   Reports sister had a ruptured cerebral aneurysm. She would like to be screened for aneurysm.    Past Medical History:  Diagnosis Date  . Anxiety   . Depression   . Dupuytren's contracture of right hand   . Iron deficiency anemia 04/15/2018  . Left breast mass   . Microcytic anemia 02/01/2018  . Perimenopause    Past Surgical History:  Procedure Laterality Date  . BREAST BIOPSY Left 2008   all findings benign   . COLONOSCOPY  2015   normal per patient  . HAND TENDON SURGERY Right 1997  . HERNIA REPAIR  02/2015  . REFRACTIVE SURGERY    . TUBAL LIGATION     Social History   Tobacco Use  . Smoking status: Never Smoker  . Smokeless tobacco: Never Used  Substance Use Topics  . Alcohol use: Not Currently    Alcohol/week: 14.0 - 21.0 standard drinks    Types: 14 - 21 Standard drinks or equivalent per week    Comment: last drink 02/03/18   family history includes Aneurysm in her sister; CVA in her sister; Colon cancer in her father; Dementia in her sister; Heart attack  in her mother; Heart failure in her father.    ROS: negative except as noted in the HPI  Medications: Current Outpatient Medications  Medication Sig Dispense Refill  . Calcium Carbonate-Vitamin D3 600-400 MG-UNIT TABS Take 1 tablet by mouth 2 (two) times daily.    Marland Kitchen docusate sodium (COLACE) 100 MG capsule Take 100 mg by mouth 2 (two) times daily as needed for constipation.    . ferrous sulfate 325 (65 FE) MG EC tablet 1 tab PO tid every other day 90 tablet 11  . naproxen (NAPROSYN) 500 MG tablet Take 1 tablet (500 mg total) by mouth every 12 (twelve) hours as needed for headache. 30 tablet 3  . venlafaxine (EFFEXOR) 75 MG tablet Take 1 tablet (75 mg total) by mouth 3 (three) times daily. 270 tablet 1  .  verapamil (CALAN-SR) 120 MG CR tablet Take 1 tablet (120 mg total) by mouth at bedtime. 30 tablet 2  . busPIRone (BUSPAR) 7.5 MG tablet Take 1 tablet (7.5 mg total) by mouth 2 (two) times daily. May take 1 additional dose every 8 hr prn breakthrough anxiety 90 tablet 0  . SUMAtriptan (IMITREX) 50 MG tablet Take 1-2 tablets (50-100 mg total) by mouth once as needed for up to 1 dose for migraine. Repeat once in 2 hours if needed (Patient not taking: Reported on 10/02/2018) 10 tablet 2   No current facility-administered medications for this visit.    Not on File     Objective:  BP 119/74   Pulse 85   Wt 131 lb (59.4 kg)   LMP 09/23/2018 (Exact Date)   BMI 23.21 kg/m  Gen:  alert, not ill-appearing, no distress, appropriate for age HEENT: head normocephalic without obvious abnormality, conjunctiva and cornea clear, trachea midline Pulm: Normal work of breathing, normal phonation Neuro: alert and oriented x 3 Psych: cooperative, euthymic mood, affect mood-congruent, speech is articulate, normal rate and volume; thought processes clear and goal-directed, normal judgment, good insight  BP Readings from Last 3 Encounters:  10/02/18 119/74  04/15/18 (!) 142/82  01/29/18 138/86   Lab Results  Component Value Date   CREATININE 0.69 10/04/2018   BUN 12 10/04/2018   NA 139 10/04/2018   K 4.5 10/04/2018   CL 104 10/04/2018   CO2 29 10/04/2018    Assessment and Plan: 53 y.o. female with   .Charlene Bell was seen today for medication management.  Diagnoses and all orders for this visit:  Hypertension goal BP (blood pressure) < 130/80 -     COMPLETE METABOLIC PANEL WITH GFR  Dysthymia -     venlafaxine (EFFEXOR) 75 MG tablet; Take 1 tablet (75 mg total) by mouth 3 (three) times daily.  Borderline hyperlipidemia -     Lipid Panel w/reflex Direct LDL  Low ferritin -     CBC  History of iron deficiency -     CBC  Medication monitoring encounter -     CBC -     COMPLETE METABOLIC  PANEL WITH GFR -     Lipid Panel w/reflex Direct LDL -     Ferritin  Family history of cerebral aneurysm Comments: sister Orders: -     MR Angiogram Head Wo Contrast; Future  Migraine without aura and without status migrainosus, not intractable -     verapamil (CALAN-SR) 120 MG CR tablet; Take 1 tablet (120 mg total) by mouth at bedtime.  Anxiety disorder, unspecified type -     busPIRone (BUSPAR) 7.5 MG tablet; Take 1  tablet (7.5 mg total) by mouth 2 (two) times daily. May take 1 additional dose every 8 hr prn breakthrough anxiety    Dysthymia, Anxiety, Hx of alcohol use disorder Cont Effexor 75 mg tid Adding Buspar 7.5 mg bid Declined medication for alcohol cravings Follow-up in 1 month  HTN Well controlled CMP pending Cont Verapamil  Migraines Increased headache frequency Cont current medications Reassess in 1 month  Follow Up Instructions:    I discussed the assessment and treatment plan with the patient. The patient was provided an opportunity to ask questions and all were answered. The patient agreed with the plan and demonstrated an understanding of the instructions.   The patient was advised to call back or seek an in-person evaluation if the symptoms worsen or if the condition fails to improve as anticipated.  I provided 15 minutes of non-face-to-face time during this encounter.   Charlene Bell, [place]PA-C

## 2018-10-04 ENCOUNTER — Telehealth: Payer: Self-pay | Admitting: Physician Assistant

## 2018-10-04 DIAGNOSIS — Z8639 Personal history of other endocrine, nutritional and metabolic disease: Secondary | ICD-10-CM | POA: Diagnosis not present

## 2018-10-04 DIAGNOSIS — I10 Essential (primary) hypertension: Secondary | ICD-10-CM | POA: Diagnosis not present

## 2018-10-04 DIAGNOSIS — R790 Abnormal level of blood mineral: Secondary | ICD-10-CM | POA: Diagnosis not present

## 2018-10-04 DIAGNOSIS — E785 Hyperlipidemia, unspecified: Secondary | ICD-10-CM | POA: Diagnosis not present

## 2018-10-04 NOTE — Telephone Encounter (Signed)
MRA case submitted. Pending review.   Please call [phone] for all Urgent Requests.  DANNIELA, PALLANES Member #: [id number] [address] [place] , [place] [id number] Date of Birth: [birth year] Phone: [phone]

## 2018-10-05 LAB — CBC
HCT: 41.6 % (ref 35.0–45.0)
Hemoglobin: 13.3 g/dL (ref 11.7–15.5)
MCH: 29 pg (ref 27.0–33.0)
MCHC: 32 g/dL (ref 32.0–36.0)
MCV: 90.8 fL (ref 80.0–100.0)
MPV: 11.7 fL (ref 7.5–12.5)
Platelets: 276 10*3/uL (ref 140–400)
RBC: 4.58 10*6/uL (ref 3.80–5.10)
RDW: 12 % (ref 11.0–15.0)
WBC: 4.3 10*3/uL (ref 3.8–10.8)

## 2018-10-05 LAB — COMPLETE METABOLIC PANEL WITH GFR
AG Ratio: 1.6 (calc) (ref 1.0–2.5)
ALT: 9 U/L (ref 6–29)
AST: 11 U/L (ref 10–35)
Albumin: 3.9 g/dL (ref 3.6–5.1)
Alkaline phosphatase (APISO): 55 U/L (ref 37–153)
BUN: 12 mg/dL (ref 7–25)
CO2: 29 mmol/L (ref 20–32)
Calcium: 9.4 mg/dL (ref 8.6–10.4)
Chloride: 104 mmol/L (ref 98–110)
Creat: 0.69 mg/dL (ref 0.50–1.05)
GFR, Est African American: 116 mL/min/{1.73_m2} (ref >=60)
GFR, Est Non African American: 100 mL/min/{1.73_m2} (ref >=60)
Globulin: 2.5 g/dL (calc) (ref 1.9–3.7)
Glucose, Bld: 86 mg/dL (ref 65–99)
Potassium: 4.5 mmol/L (ref 3.5–5.3)
Sodium: 139 mmol/L (ref 135–146)
Total Bilirubin: 0.5 mg/dL (ref 0.2–1.2)
Total Protein: 6.4 g/dL (ref 6.1–8.1)

## 2018-10-05 LAB — LIPID PANEL W/REFLEX DIRECT LDL
Cholesterol: 237 mg/dL — ABNORMAL HIGH (ref <200)
HDL: 66 mg/dL (ref >=50)
LDL Cholesterol (Calc): 156 mg/dL (calc) — ABNORMAL HIGH
Non-HDL Cholesterol (Calc): 171 mg/dL (calc) — ABNORMAL HIGH (ref <130)
Total CHOL/HDL Ratio: 3.6 (calc) (ref <5.0)
Triglycerides: 48 mg/dL (ref <150)

## 2018-10-05 LAB — FERRITIN: Ferritin: 8 ng/mL — ABNORMAL LOW (ref 16–232)

## 2018-10-08 ENCOUNTER — Encounter: Payer: Self-pay | Admitting: Physician Assistant

## 2018-10-09 ENCOUNTER — Encounter: Payer: Self-pay | Admitting: Physician Assistant

## 2018-10-09 NOTE — Telephone Encounter (Signed)
Number [id number] Valid 5/22-8/19

## 2018-10-09 NOTE — Telephone Encounter (Signed)
Imaging notified  

## 2018-10-14 DIAGNOSIS — F419 Anxiety disorder, unspecified: Secondary | ICD-10-CM | POA: Insufficient documentation

## 2018-10-20 ENCOUNTER — Ambulatory Visit (INDEPENDENT_AMBULATORY_CARE_PROVIDER_SITE_OTHER): Payer: BC Managed Care – PPO

## 2018-10-20 ENCOUNTER — Other Ambulatory Visit: Payer: Self-pay

## 2018-10-20 DIAGNOSIS — Z8249 Family history of ischemic heart disease and other diseases of the circulatory system: Secondary | ICD-10-CM

## 2018-10-20 DIAGNOSIS — Z8489 Family history of other specified conditions: Secondary | ICD-10-CM | POA: Diagnosis not present

## 2018-10-28 ENCOUNTER — Other Ambulatory Visit: Payer: Self-pay | Admitting: Physician Assistant

## 2018-10-28 DIAGNOSIS — F419 Anxiety disorder, unspecified: Secondary | ICD-10-CM

## 2018-11-24 ENCOUNTER — Other Ambulatory Visit: Payer: Self-pay | Admitting: Physician Assistant

## 2018-11-24 DIAGNOSIS — F419 Anxiety disorder, unspecified: Secondary | ICD-10-CM

## 2018-12-21 ENCOUNTER — Other Ambulatory Visit: Payer: Self-pay | Admitting: Physician Assistant

## 2018-12-21 DIAGNOSIS — F419 Anxiety disorder, unspecified: Secondary | ICD-10-CM

## 2019-01-19 ENCOUNTER — Other Ambulatory Visit: Payer: Self-pay | Admitting: Physician Assistant

## 2019-01-19 DIAGNOSIS — G43009 Migraine without aura, not intractable, without status migrainosus: Secondary | ICD-10-CM

## 2019-01-26 ENCOUNTER — Other Ambulatory Visit: Payer: Self-pay | Admitting: Physician Assistant

## 2019-01-26 DIAGNOSIS — F419 Anxiety disorder, unspecified: Secondary | ICD-10-CM

## 2019-01-27 NOTE — Telephone Encounter (Signed)
Charlene Bell please call this patient and schedule her a follow up appt. Thank You

## 2019-01-27 NOTE — Telephone Encounter (Signed)
Please contact patient to schedule follow up appt.

## 2019-01-29 NOTE — Telephone Encounter (Signed)
Appointment has been made. No further questions at this time.  

## 2019-01-31 ENCOUNTER — Other Ambulatory Visit: Payer: Self-pay

## 2019-01-31 ENCOUNTER — Ambulatory Visit (INDEPENDENT_AMBULATORY_CARE_PROVIDER_SITE_OTHER): Payer: BC Managed Care – PPO | Admitting: Physician Assistant

## 2019-01-31 ENCOUNTER — Encounter: Payer: Self-pay | Admitting: Physician Assistant

## 2019-01-31 VITALS — BP 144/89 | HR 65 | Temp 97.9°F | Wt 141.0 lb

## 2019-01-31 DIAGNOSIS — G43009 Migraine without aura, not intractable, without status migrainosus: Secondary | ICD-10-CM | POA: Diagnosis not present

## 2019-01-31 DIAGNOSIS — Z23 Encounter for immunization: Secondary | ICD-10-CM | POA: Diagnosis not present

## 2019-01-31 DIAGNOSIS — Z800 Family history of malignant neoplasm of digestive organs: Secondary | ICD-10-CM

## 2019-01-31 DIAGNOSIS — F419 Anxiety disorder, unspecified: Secondary | ICD-10-CM

## 2019-01-31 DIAGNOSIS — Z1231 Encounter for screening mammogram for malignant neoplasm of breast: Secondary | ICD-10-CM

## 2019-01-31 DIAGNOSIS — Z1211 Encounter for screening for malignant neoplasm of colon: Secondary | ICD-10-CM

## 2019-01-31 DIAGNOSIS — I10 Essential (primary) hypertension: Secondary | ICD-10-CM

## 2019-01-31 MED ORDER — BUSPIRONE HCL 7.5 MG PO TABS: 7.5000 mg | ORAL_TABLET | Freq: Two times a day (BID) | ORAL | 2 refills | Status: DC

## 2019-01-31 NOTE — Patient Instructions (Addendum)
For your blood pressure: - Goal <130/80 (Ideally 120's/70's) - monitor and log blood pressures at home - check around the same time each day in a relaxed setting - Limit salt to <2500 mg/day - Follow DASH (Dietary Approach to Stopping Hypertension) eating plan - Try to get at least 150 minutes of aerobic exercise per week - Aim to go on a brisk walk 30 minutes per day at least 5 days per week. If you're not active, gradually increase how long you walk by 5 minutes each week - limit alcohol: 2 standard drinks per day for men and 1 per day for women - avoid tobacco/nicotine products. Consider smoking cessation if you smoke - weight loss: 7% of current body weight can reduce your blood pressure by 5-10 points - follow-up at least every 6 months for your blood pressure. Follow-up sooner if your BP is not controlled   Living With Anxiety  After being diagnosed with an anxiety disorder, you may be relieved to know why you have felt or behaved a certain way. It is natural to also feel overwhelmed about the treatment ahead and what it will mean for your life. With care and support, you can manage this condition and recover from it. How to cope with anxiety Dealing with stress Stress is your body's reaction to life changes and events, both good and bad. Stress can last just a few hours or it can be ongoing. Stress can play a major role in anxiety, so it is important to learn both how to cope with stress and how to think about it differently. Talk with your health care provider or a counselor to learn more about stress reduction. He or she may suggest some stress reduction techniques, such as:  Music therapy. This can include creating or listening to music that you enjoy and that inspires you.  Mindfulness-based meditation. This involves being aware of your normal breaths, rather than trying to control your breathing. It can be done while sitting or walking.  Centering prayer. This is a kind of  meditation that involves focusing on a word, phrase, or sacred image that is meaningful to you and that brings you peace.  Deep breathing. To do this, expand your stomach and inhale slowly through your nose. Hold your breath for 3-5 seconds. Then exhale slowly, allowing your stomach muscles to relax.  Self-talk. This is a skill where you identify thought patterns that lead to anxiety reactions and correct those thoughts.  Muscle relaxation. This involves tensing muscles then relaxing them. Choose a stress reduction technique that fits your lifestyle and personality. Stress reduction techniques take time and practice. Set aside 5-15 minutes a day to do them. Therapists can offer training in these techniques. The training may be covered by some insurance plans. Other things you can do to manage stress include:  Keeping a stress diary. This can help you learn what triggers your stress and ways to control your response.  Thinking about how you respond to certain situations. You may not be able to control everything, but you can control your reaction.  Making time for activities that help you relax, and not feeling guilty about spending your time in this way. Therapy combined with coping and stress-reduction skills provides the best chance for successful treatment. Medicines Medicines can help ease symptoms. Medicines for anxiety include:  Anti-anxiety drugs.  Antidepressants.  Beta-blockers. Medicines may be used as the main treatment for anxiety disorder, along with therapy, or if other treatments are not working.  Medicines should be prescribed by a health care provider. Relationships Relationships can play a big part in helping you recover. Try to spend more time connecting with trusted friends and family members. Consider going to couples counseling, taking family education classes, or going to family therapy. Therapy can help you and others better understand the condition. How to recognize  changes in your condition Everyone has a different response to treatment for anxiety. Recovery from anxiety happens when symptoms decrease and stop interfering with your daily activities at home or work. This may mean that you will start to:  Have better concentration and focus.  Sleep better.  Be less irritable.  Have more energy.  Have improved memory. It is important to recognize when your condition is getting worse. Contact your health care provider if your symptoms interfere with home or work and you do not feel like your condition is improving. Where to find help and support: You can get help and support from these sources:  Self-help groups.  Online and [employer] organizations.  A trusted spiritual leader.  Couples counseling.  Family education classes.  Family therapy. Follow these instructions at home:  Eat a healthy diet that includes plenty of vegetables, fruits, whole grains, low-fat dairy products, and lean protein. Do not eat a lot of foods that are high in solid fats, added sugars, or salt.  Exercise. Most adults should do the following: ? Exercise for at least 150 minutes each week. The exercise should increase your heart rate and make you sweat (moderate-intensity exercise). ? Strengthening exercises at least twice a week.  Cut down on caffeine, tobacco, alcohol, and other potentially harmful substances.  Get the right amount and quality of sleep. Most adults need 7-9 hours of sleep each night.  Make choices that simplify your life.  Take over-the-counter and prescription medicines only as told by your health care provider.  Avoid caffeine, alcohol, and certain over-the-counter cold medicines. These may make you feel worse. Ask your pharmacist which medicines to avoid.  Keep all follow-up visits as told by your health care provider. This is important. Questions to ask your health care provider  Would I benefit from therapy?  How often should I follow  up with a health care provider?  How long do I need to take medicine?  Are there any long-term side effects of my medicine?  Are there any alternatives to taking medicine? Contact a health care provider if:  You have a hard time staying focused or finishing daily tasks.  You spend many hours a day feeling worried about everyday life.  You become exhausted by worry.  You start to have headaches, feel tense, or have nausea.  You urinate more than normal.  You have diarrhea. Get help right away if:  You have a racing heart and shortness of breath.  You have thoughts of hurting yourself or others. If you ever feel like you may hurt yourself or others, or have thoughts about taking your own life, get help right away. You can go to your nearest emergency department or call:  Your local emergency services (911 in the [place]  A suicide crisis helpline, such as the National Suicide Prevention Lifeline at (780)689-99761-409 737 4269. This is open 24-hours a day. Summary  Taking steps to deal with stress can help calm you.  Medicines cannot cure anxiety disorders, but they can help ease symptoms.  Family, friends, and partners can play a big part in helping you recover from an anxiety disorder. This information is  not intended to replace advice given to you by your health care provider. Make sure you discuss any questions you have with your health care provider. Document Released: 04/25/2016 Document Revised: 04/13/2017 Document Reviewed: 04/25/2016 Elsevier Patient Education  2020 [employer].

## 2019-01-31 NOTE — Progress Notes (Signed)
HPI:                                                                Charlene Bell is a 52 y.o. female who presents to [facility] today for medication management  Migraines: reports they are much better since making lifestyle changes (exercise, sleep). She is having approx 1 mild headache day per week that improves throughout the day. Has not need imitrex.  Anxiety: at last office visit Effexor was augmented with Buspar 7.5 mg bid. She reports this has helped quite a bit. Every once in a while if she knows she is going to be triggered by something she will take an extra dose of Buspar and this helps. Sleep overall is good. She has had some trouble calming her brain at night about 2 nights per week. Overall it is not affecting her negatively. States alcohol craving is still present, but mild and within her control. She will be sober 1 year on 9/23.   HTN: taking Verapamil 120 mg daily for co-morbid. Compliant with medications. Does not check BP's at home. Denies vision change, headache, chest pain with exertion, orthopnea, lightheadedness, syncope and edema. Risk factors include: family hx   Depression screen [facility] 2/9 01/31/2019 04/15/2018 01/10/2018  Decreased Interest 1 1 2   Down, Depressed, Hopeless 1 1 2   PHQ - 2 Score 2 2 4   Altered sleeping 0 2 1  Tired, decreased energy 1 1 2   Change in appetite 1 0 0  Feeling bad or failure about yourself  1 1 2   Trouble concentrating 0 0 0  Moving slowly or fidgety/restless 0 0 0  Suicidal thoughts 0 0 0  PHQ-9 Score 5 6 9   Difficult doing work/chores Somewhat difficult Not difficult at all Somewhat difficult    GAD 7 : Generalized Anxiety Score 01/31/2019 04/15/2018  Nervous, Anxious, on Edge 1 2  Control/stop worrying 0 0  Worry too much - different things 0 0  Trouble relaxing 0 0  Restless 0 0  Easily annoyed or irritable 1 2  Afraid - awful might happen 0 0  Total GAD 7 Score 2 4  Anxiety  Difficulty Not difficult at all -      Past Medical History:  Diagnosis Date  . Anxiety   . Depression   . Dupuytren's contracture of right hand   . Iron deficiency anemia 04/15/2018  . Left breast mass   . Microcytic anemia 02/01/2018  . Perimenopause    Past Surgical History:  Procedure Laterality Date  . BREAST BIOPSY Left 2008   all findings benign   . COLONOSCOPY  2015   normal per patient  . HAND TENDON SURGERY Right 1997  . HERNIA REPAIR  02/2015  . REFRACTIVE SURGERY    . TUBAL LIGATION     Social History   Tobacco Use  . Smoking status: Never Smoker  . Smokeless tobacco: Never Used  Substance Use Topics  . Alcohol use: Not Currently    Alcohol/week: 14.0 - 21.0 standard drinks    Types: 14 - 21 Standard drinks or equivalent per week    Comment: last drink 02/03/18   family history includes Aneurysm in her sister; CVA in her sister; Colon cancer in  her father; Dementia in her sister; Heart attack in her mother; Heart failure in her father.    ROS: negative except as noted in the HPI  Medications: Current Outpatient Medications  Medication Sig Dispense Refill  . busPIRone (BUSPAR) 7.5 MG tablet Take 1 tablet (7.5 mg total) by mouth 2 (two) times daily. May take 1 additional dose prn 210 tablet 2  . Calcium Carbonate-Vitamin D3 600-400 MG-UNIT TABS Take 1 tablet by mouth 2 (two) times daily.    Marland Kitchen docusate sodium (COLACE) 100 MG capsule Take 100 mg by mouth 2 (two) times daily as needed for constipation.    . ferrous sulfate 325 (65 FE) MG EC tablet 1 tab PO tid every other day 90 tablet 11  . naproxen (NAPROSYN) 500 MG tablet Take 1 tablet (500 mg total) by mouth every 12 (twelve) hours as needed for headache. 30 tablet 3  . SUMAtriptan (IMITREX) 50 MG tablet Take 1-2 tablets (50-100 mg total) by mouth once as needed for up to 1 dose for migraine. Repeat once in 2 hours if needed 10 tablet 2  . venlafaxine (EFFEXOR) 75 MG tablet Take 1 tablet (75 mg total) by  mouth 3 (three) times daily. 270 tablet 1  . verapamil (CALAN-SR) 120 MG CR tablet TAKE 1 TABLET (120 MG TOTAL) BY MOUTH AT BEDTIME. 30 tablet 0   No current facility-administered medications for this visit.    Not on File     Objective:  BP (!) 144/89   Pulse 65   Temp 97.9 F (36.6 C) (Oral)   Wt 141 lb (64 kg)   BMI 24.98 kg/m   Vitals:   01/31/19 1101 01/31/19 1123  BP: (!) 146/84 (!) 144/89  Pulse: 68 65  Temp: 97.9 F (36.6 C)     Wt Readings from Last 3 Encounters:  01/31/19 141 lb (64 kg)  10/02/18 131 lb (59.4 kg)  04/15/18 135 lb (61.2 kg)   Temp Readings from Last 3 Encounters:  01/31/19 97.9 F (36.6 C) (Oral)   BP Readings from Last 3 Encounters:  01/31/19 (!) 144/89  10/02/18 119/74  04/15/18 (!) 142/82   Pulse Readings from Last 3 Encounters:  01/31/19 65  10/02/18 85  04/15/18 65    Gen:  alert, not ill-appearing, no distress, appropriate for age 74: head normocephalic without obvious abnormality, conjunctiva and cornea clear, trachea midline Pulm: Normal work of breathing, normal phonation, clear to auscultation bilaterally, no wheezes, rales or rhonchi CV: Normal rate, regular rhythm, s1 and s2 distinct, no murmurs, clicks or rubs  Neuro: alert and oriented x 3, no tremor MSK: extremities atraumatic, normal gait and station Skin: intact, no rashes on exposed skin, no jaundice, no cyanosis Psych: well-groomed, cooperative, good eye contact, depressed mood, affect mood-congruent, speech is articulate, and thought processes clear and goal-directed, no SI   Lab Results  Component Value Date   CREATININE 0.69 10/04/2018   BUN 12 10/04/2018   NA 139 10/04/2018   K 4.5 10/04/2018   CL 104 10/04/2018   CO2 29 10/04/2018   Lab Results  Component Value Date   CHOL 237 (H) 10/04/2018   HDL 66 10/04/2018   LDLCALC 156 (H) 10/04/2018   TRIG 48 10/04/2018   CHOLHDL 3.6 10/04/2018   The 10-year ASCVD risk score Mikey Bussing DC Jr., et al., 2013)  is: 2.5%   Values used to calculate the score:     Age: 69 years     Sex: Female  Is Non-Hispanic African American: No     Diabetic: No     Tobacco smoker: No     Systolic Blood Pressure: 144 mmHg     Is BP treated: Yes     HDL Cholesterol: 66 mg/dL     Total Cholesterol: 237 mg/dL  Assessment and Plan: 53 y.o. female with  .Barth [name] was seen today for medication management.  Diagnoses and all orders for this visit:  Colon cancer screening -     Ambulatory referral to Gastroenterology  Need for immunization against influenza -     Flu Vaccine QUAD 36+ mos IM  Anxiety disorder, unspecified type -     busPIRone (BUSPAR) 7.5 MG tablet; Take 1 tablet (7.5 mg total) by mouth 2 (two) times daily. May take 1 additional dose prn  Family history of colon cancer in father -     Ambulatory referral to Gastroenterology  Breast cancer screening by mammogram -     MM 3D SCREEN BREAST BILATERAL; Future  Migraine without aura and without status migrainosus, not intractable  Hypertension goal BP (blood pressure) < 130/80   HTN BP Readings from Last 3 Encounters:  01/31/19 (!) 144/89  10/02/18 119/74  04/15/18 (!) 142/82  BP out of range on 2 readings today, asymptomatic Patient reports she is under a lot of stress today 10 yr ASCVD risk 2.5% She agrees to re-start home BP monitoring and contact our office for readings consistently above 140/90. Would recommend increasing Verapamil at that point Cont therapeutic lifestyle changes including stress management  Anxiety GAD7=2 Well controlled Cont current medications  Reviewed health maintenance Colonoscopy due Nov 2020 Mammogram due this month    Patient education and anticipatory guidance given Patient agrees with treatment plan Follow-up in 6 months or sooner as needed if symptoms worsen or fail to improve  Levonne Hubertharley E.  PA-C

## 2019-02-13 ENCOUNTER — Encounter: Payer: Self-pay | Admitting: Gastroenterology

## 2019-02-24 ENCOUNTER — Other Ambulatory Visit: Payer: Self-pay | Admitting: Physician Assistant

## 2019-02-24 DIAGNOSIS — G43009 Migraine without aura, not intractable, without status migrainosus: Secondary | ICD-10-CM

## 2019-02-25 ENCOUNTER — Other Ambulatory Visit: Payer: Self-pay | Admitting: Physician Assistant

## 2019-02-25 DIAGNOSIS — D509 Iron deficiency anemia, unspecified: Secondary | ICD-10-CM

## 2019-02-25 DIAGNOSIS — F341 Dysthymic disorder: Secondary | ICD-10-CM

## 2019-02-26 ENCOUNTER — Ambulatory Visit (INDEPENDENT_AMBULATORY_CARE_PROVIDER_SITE_OTHER): Payer: BC Managed Care – PPO

## 2019-02-26 ENCOUNTER — Other Ambulatory Visit: Payer: Self-pay | Admitting: Physician Assistant

## 2019-02-26 ENCOUNTER — Other Ambulatory Visit: Payer: Self-pay

## 2019-02-26 DIAGNOSIS — Z1231 Encounter for screening mammogram for malignant neoplasm of breast: Secondary | ICD-10-CM | POA: Diagnosis not present

## 2019-02-26 DIAGNOSIS — G43009 Migraine without aura, not intractable, without status migrainosus: Secondary | ICD-10-CM

## 2019-02-27 MED ORDER — VERAPAMIL HCL ER 120 MG PO TBCR: 120.0000 mg | EXTENDED_RELEASE_TABLET | Freq: Every day | ORAL | 1 refills | Status: DC

## 2019-02-27 NOTE — Telephone Encounter (Signed)
Last seen in September and advised to follow up in 6 months.

## 2019-03-04 ENCOUNTER — Ambulatory Visit (AMBULATORY_SURGERY_CENTER): Payer: BC Managed Care – PPO | Admitting: *Deleted

## 2019-03-04 ENCOUNTER — Other Ambulatory Visit: Payer: Self-pay

## 2019-03-04 ENCOUNTER — Encounter: Payer: Self-pay | Admitting: Gastroenterology

## 2019-03-04 VITALS — Temp 97.1°F | Ht 64.0 in | Wt 139.0 lb

## 2019-03-04 DIAGNOSIS — Z1159 Encounter for screening for other viral diseases: Secondary | ICD-10-CM

## 2019-03-04 DIAGNOSIS — Z1211 Encounter for screening for malignant neoplasm of colon: Secondary | ICD-10-CM

## 2019-03-04 MED ORDER — SUPREP BOWEL PREP KIT 17.5-3.13-1.6 GM/177ML PO SOLN: 1.0000 | Freq: Once | ORAL | 0 refills | Status: AC

## 2019-03-04 NOTE — Progress Notes (Signed)
No egg or soy allergy known to patient  No issues with past sedation with any surgeries  or procedures, no intubation problems  No diet pills per patient No home 02 use per patient  No blood thinners per patient  Pt takes iron and has some issues with constipation  Patient will stop iron on 03/12/2019 and start Miralax daily  COVID test 0835, 03/12/2019 No A fib or A flutter  EMMI information given   Due to the COVID-19 pandemic we are asking patients to follow these guidelines. Please only bring one care partner. Please be aware that your care partner may wait in the car in the parking lot or if they feel like they will be too hot to wait in the car, they may wait in the lobby on the 4th floor. All care partners are required to wear a mask the entire time (we do not have any that we can provide them), they need to practice social distancing, and we will do a Covid check for all patient's and care partners when you arrive. Also we will check their temperature and your temperature. If the care partner waits in their car they need to stay in the parking lot the entire time and we will call them on their cell phone when the patient is ready for discharge so they can bring the car to the front of the building. Also all patient's will need to wear a mask into building.

## 2019-03-12 ENCOUNTER — Other Ambulatory Visit: Payer: Self-pay | Admitting: Gastroenterology

## 2019-03-12 DIAGNOSIS — Z1159 Encounter for screening for other viral diseases: Secondary | ICD-10-CM | POA: Diagnosis not present

## 2019-03-13 LAB — SARS CORONAVIRUS 2 (TAT 6-24 HRS): SARS Coronavirus 2: NEGATIVE

## 2019-03-17 ENCOUNTER — Ambulatory Visit (AMBULATORY_SURGERY_CENTER): Payer: BC Managed Care – PPO | Admitting: Gastroenterology

## 2019-03-17 ENCOUNTER — Encounter: Payer: Self-pay | Admitting: Gastroenterology

## 2019-03-17 ENCOUNTER — Other Ambulatory Visit: Payer: Self-pay

## 2019-03-17 VITALS — BP 124/60 | HR 62 | Temp 98.4°F | Resp 16 | Ht 64.0 in | Wt 139.0 lb

## 2019-03-17 DIAGNOSIS — Z800 Family history of malignant neoplasm of digestive organs: Secondary | ICD-10-CM

## 2019-03-17 DIAGNOSIS — Z1211 Encounter for screening for malignant neoplasm of colon: Secondary | ICD-10-CM

## 2019-03-17 MED ORDER — SODIUM CHLORIDE 0.9 % IV SOLN: 500.0000 mL | Freq: Once | INTRAVENOUS | Status: DC

## 2019-03-17 NOTE — Patient Instructions (Signed)
Thank you for allowing [place] to care for you today!  Recommend next screening colonoscopy in 5 years.  Resume previous diet and medications today.  Resume your normal activities tomorrow.  Recommend high fiber diet.    YOU HAD AN ENDOSCOPIC PROCEDURE TODAY AT [facility]:   Refer to the procedure report that was given to you for any specific questions about what was found during the examination.  If the procedure report does not answer your questions, please call your gastroenterologist to clarify.  If you requested that your care partner not be given the details of your procedure findings, then the procedure report has been included in a sealed envelope for you to review at your convenience later.  YOU SHOULD EXPECT: Some feelings of bloating in the abdomen. Passage of more gas than usual.  Walking can help get rid of the air that was put into your GI tract during the procedure and reduce the bloating. If you had a lower endoscopy (such as a colonoscopy or flexible sigmoidoscopy) you may notice spotting of blood in your stool or on the toilet paper. If you underwent a bowel prep for your procedure, you may not have a normal bowel movement for a few days.  Please Note:  You might notice some irritation and congestion in your nose or some drainage.  This is from the oxygen used during your procedure.  There is no need for concern and it should clear up in a day or so.  SYMPTOMS TO REPORT IMMEDIATELY:   Following lower endoscopy (colonoscopy or flexible sigmoidoscopy):  Excessive amounts of blood in the stool  Significant tenderness or worsening of abdominal pains  Swelling of the abdomen that is new, acute  Fever of 100F or higher   Following upper endoscopy (EGD)  Vomiting of blood or coffee ground material  New chest pain or pain under the shoulder blades  Painful or persistently difficult swallowing  New shortness of breath  Fever of 100F or higher  Black,  tarry-looking stools  For urgent or emergent issues, a gastroenterologist can be reached at any hour by calling [phone].   DIET:  We do recommend a small meal at first, but then you may proceed to your regular diet.  Drink plenty of fluids but you should avoid alcoholic beverages for 24 hours.  ACTIVITY:  You should plan to take it easy for the rest of today and you should NOT DRIVE or use heavy machinery until tomorrow (because of the sedation medicines used during the test).    FOLLOW UP: Our staff will call the number listed on your records 48-72 hours following your procedure to check on you and address any questions or concerns that you may have regarding the information given to you following your procedure. If we do not reach you, we will leave a message.  We will attempt to reach you two times.  During this call, we will ask if you have developed any symptoms of COVID 19. If you develop any symptoms (ie: fever, flu-like symptoms, shortness of breath, cough etc.) before then, please call [phone].  If you test positive for Covid 19 in the 2 weeks post procedure, please call and report this information to [place].    If any biopsies were taken you will be contacted by phone or by letter within the next 1-3 weeks.  Please call us at [phone] if you have not heard about the biopsies in 3 weeks.    SIGNATURES/CONFIDENTIALITY:  You and/or your care partner have signed paperwork which will be entered into your electronic medical record.  These signatures attest to the fact that that the information above on your After Visit Summary has been reviewed and is understood.  Full responsibility of the confidentiality of this discharge information lies with you and/or your care-partner.

## 2019-03-17 NOTE — Progress Notes (Signed)
LC - Temp  CW/KA - VS  Pt's states no medical or surgical changes since previsit or office visit.

## 2019-03-17 NOTE — Progress Notes (Signed)
Report given to PACU, vss 

## 2019-03-17 NOTE — Op Note (Signed)
[place] Patient Name: Charlene Bell Procedure Date: 03/17/2019 9:17 AM MRN: [id number] Endoscopist: Ladene Artist , MD Age: 53 Referring MD:  Date of Birth: November 04, 1965 Gender: Female Account #: [id number] Procedure:                Colonoscopy Indications:              Screening in patient at increased risk: Family                            history of 1st-degree relative with colorectal                            cancer Medicines:                Monitored Anesthesia Care Procedure:                Pre-Anesthesia Assessment:                           - Prior to the procedure, a History and Physical                            was performed, and patient medications and                            allergies were reviewed. The patient's tolerance of                            previous anesthesia was also reviewed. The risks                            and benefits of the procedure and the sedation                            options and risks were discussed with the patient.                            All questions were answered, and informed consent                            was obtained. Prior Anticoagulants: The patient has                            taken no previous anticoagulant or antiplatelet                            agents. ASA Grade Assessment: II - A patient with                            mild systemic disease. After reviewing the risks                            and benefits, the patient was deemed in  satisfactory condition to undergo the procedure.                           After obtaining informed consent, the colonoscope                            was passed under direct vision. Throughout the                            procedure, the patient's blood pressure, pulse, and                            oxygen saturations were monitored continuously. The                            Colonoscope was introduced through the anus and                  advanced to the the cecum, identified by                            appendiceal orifice and ileocecal valve. The                            ileocecal valve, appendiceal orifice, and rectum                            were photographed. The quality of the bowel                            preparation was good. The colonoscopy was performed                            without difficulty. The patient tolerated the                            procedure well. Scope In: 9:25:11 AM Scope Out: 9:37:38 AM Scope Withdrawal Time: 0 hours 10 minutes 20 seconds  Total Procedure Duration: 0 hours 12 minutes 27 seconds  Findings:                 The perianal and digital rectal examinations were                            normal.                           A few small-mouthed diverticula were found in the                            left colon.                           Internal hemorrhoids were found during                            retroflexion. The hemorrhoids were small and  Grade                            I (internal hemorrhoids that do not prolapse).                           The exam was otherwise without abnormality on                            direct and retroflexion views. Complications:            No immediate complications. Estimated blood loss:                            None. Estimated Blood Loss:     Estimated blood loss: none. Impression:               - Mild diverticulosis in the left colon.                           - Internal hemorrhoids.                           - The examination was otherwise normal on direct                            and retroflexion views.                           - No specimens collected. Recommendation:           - Repeat colonoscopy in 5 years for screening                            purposes.                           - Patient has a contact number available for                            emergencies. The signs and symptoms of potential                             delayed complications were discussed with the                            patient. Return to normal activities tomorrow.                            Written discharge instructions were provided to the                            patient.                           - High fiber diet.                           -  Continue present medications. Meryl Dare, MD 03/17/2019 9:41:33 AM This report has been signed electronically.

## 2019-03-19 ENCOUNTER — Telehealth: Payer: Self-pay

## 2019-03-19 NOTE — Telephone Encounter (Signed)
  Follow up Call-  Call back number 03/17/2019  Post procedure Call Back phone  # [phone] cell  Permission to leave phone message Yes     Patient questions:  Do you have a fever, pain , or abdominal swelling? No. Pain Score  0 *  Have you tolerated food without any problems? Yes.    Have you been able to return to your normal activities? Yes.    Do you have any questions about your discharge instructions: Diet   No. Medications  No. Follow up visit  No.  Do you have questions or concerns about your Care? No.  Actions: * If pain score is 4 or above: No action needed, pain <4.  1. Have you developed a fever since your procedure? no  2.   Have you had an respiratory symptoms (SOB or cough) since your procedure? no  3.   Have you tested positive for COVID 19 since your procedure no  4.   Have you had any family members/close contacts diagnosed with the COVID 19 since your procedure?  no   If yes to any of these questions please route to Joylene John, RN and Alphonsa Gin, Therapist, sports.

## 2019-04-18 ENCOUNTER — Encounter: Payer: Self-pay | Admitting: Osteopathic Medicine

## 2019-04-18 ENCOUNTER — Other Ambulatory Visit: Payer: Self-pay

## 2019-04-18 DIAGNOSIS — Z20822 Contact with and (suspected) exposure to covid-19: Secondary | ICD-10-CM

## 2019-04-18 NOTE — Telephone Encounter (Signed)
FYI

## 2019-04-21 ENCOUNTER — Telehealth: Payer: Self-pay

## 2019-04-21 LAB — NOVEL CORONAVIRUS, NAA: SARS-CoV-2, NAA: DETECTED — AB

## 2019-04-21 NOTE — Telephone Encounter (Signed)
Received call from patient checking Covid results.  Advised results are positive.  Advised to contact PCP or visit ED if symptoms worsen.   

## 2019-06-18 DIAGNOSIS — M20021 Boutonniere deformity of right finger(s): Secondary | ICD-10-CM | POA: Diagnosis not present

## 2019-06-18 DIAGNOSIS — M720 Palmar fascial fibromatosis [Dupuytren]: Secondary | ICD-10-CM | POA: Diagnosis not present

## 2019-06-27 ENCOUNTER — Other Ambulatory Visit: Payer: Self-pay | Admitting: Physician Assistant

## 2019-06-27 DIAGNOSIS — G43009 Migraine without aura, not intractable, without status migrainosus: Secondary | ICD-10-CM

## 2019-07-30 DIAGNOSIS — M720 Palmar fascial fibromatosis [Dupuytren]: Secondary | ICD-10-CM | POA: Diagnosis not present

## 2019-07-30 DIAGNOSIS — M20021 Boutonniere deformity of right finger(s): Secondary | ICD-10-CM | POA: Diagnosis not present

## 2019-07-31 ENCOUNTER — Encounter: Payer: Self-pay | Admitting: Osteopathic Medicine

## 2019-07-31 ENCOUNTER — Ambulatory Visit (INDEPENDENT_AMBULATORY_CARE_PROVIDER_SITE_OTHER): Payer: BC Managed Care – PPO | Admitting: Osteopathic Medicine

## 2019-07-31 VITALS — BP 136/86 | HR 71 | Temp 98.2°F | Wt 134.0 lb

## 2019-07-31 DIAGNOSIS — Z0000 Encounter for general adult medical examination without abnormal findings: Secondary | ICD-10-CM

## 2019-07-31 DIAGNOSIS — Z23 Encounter for immunization: Secondary | ICD-10-CM | POA: Diagnosis not present

## 2019-07-31 DIAGNOSIS — F419 Anxiety disorder, unspecified: Secondary | ICD-10-CM | POA: Diagnosis not present

## 2019-07-31 DIAGNOSIS — I10 Essential (primary) hypertension: Secondary | ICD-10-CM

## 2019-07-31 DIAGNOSIS — R790 Abnormal level of blood mineral: Secondary | ICD-10-CM

## 2019-07-31 DIAGNOSIS — G43009 Migraine without aura, not intractable, without status migrainosus: Secondary | ICD-10-CM

## 2019-07-31 MED ORDER — VERAPAMIL HCL ER 120 MG PO TBCR: 120.0000 mg | EXTENDED_RELEASE_TABLET | Freq: Every day | ORAL | 3 refills | Status: DC

## 2019-07-31 MED ORDER — BUSPIRONE HCL 15 MG PO TABS: 7.5000 mg | ORAL_TABLET | Freq: Two times a day (BID) | ORAL | 3 refills | Status: DC

## 2019-07-31 MED ORDER — VENLAFAXINE HCL 75 MG PO TABS: 75.0000 mg | ORAL_TABLET | Freq: Three times a day (TID) | ORAL | 3 refills | Status: DC

## 2019-07-31 MED ORDER — ASPIRIN-ACETAMINOPHEN-CAFFEINE 250-250-65 MG PO TABS: 1.0000 | ORAL_TABLET | Freq: Four times a day (QID) | ORAL | 5 refills | Status: DC | PRN

## 2019-07-31 NOTE — Patient Instructions (Signed)
General Preventive Care  Most recent routine screening lipids/other labs: ordered today!    Everyone should have blood pressure checked once per year.   Tobacco: don't! Please let me know if you need help quitting!  Alcohol: responsible moderation is ok for many adults, but not everyone - if you have concerns about your alcohol intake, please talk to me!   Exercise: as tolerated to reduce risk of cardiovascular disease and diabetes. Strength training will also prevent osteoporosis.   Mental health: if need for mental health care (medicines, counseling, other), or concerns about moods, please let me know!   Sexual health: if need for STD testing, or if concerns with libido/pain problems, please let me know!   Advanced Directive: Living Will and/or Healthcare Power of Attorney recommended for all adults, regardless of age or health.  Vaccines  Flu vaccine: recommended for almost everyone, every fall.   Shingles vaccine: Shingrix recommended after age 71. Shot #2 due 2-6 months.   Pneumonia vaccines: Prevnar and Pneumovax recommended after age 93, or sooner if certain medical conditions.  Tetanus booster: Tdap recommended every 10 years.   COVID: recommended as soon as you're eligible! Please follow [email or web address] for updates on eligibility and vaccination appointment. As of now, we do not anticipate our clinic having vaccines anytime soon.  Cancer screenings   Colon cancer screening: 03/2024  Breast cancer screening: mammogram annually after age 57.   Cervical cancer screening: Pap due 01/2023  Lung cancer screening: not needed for non-smokers  Infection screenings . HIV: recommended screening at least once age 46-65, more often as needed. . Gonorrhea/Chlamydia: screening as needed . Hepatitis C: recommended for anyone born 40-1965 . TB: certain at-risk populations Other  Bone Density Test: recommended for women at age 29

## 2019-07-31 NOTE — Progress Notes (Signed)
Charlene Bell is a 54 y.o. female who presents to  [facility] at [facility]  today, 07/31/19, seeking care for the following: . Establish new PCP (previous left this practice)  . Annual Physical  . Follow-up chronic conditions: HTN, migraines, anxiety   Physical exam  Constitutional:  . VSS, see nurse notes . General Appearance: alert, well-developed, well-nourished, NAD Eyes: Marland Kitchen Normal lids and conjunctive, non-icteric sclera . PERRLA Ears, Nose, Mouth, Throat: . Normal external auditory canal and TM bilaterally Neck: . No masses, trachea midline . No thyroid enlargement/tenderness/mass appreciated Respiratory: . Normal respiratory effort . No dullness/hyper-resonance to percussion . Breath sounds normal, no wheeze/rhonchi/rales Cardiovascular: . S1/S2 normal, no murmur/rub/gallop auscultated . No lower extremity edema Gastrointestinal: . Nontender, no masses . No hepatomegaly, no splenomegaly . No hernia appreciated Musculoskeletal:  . Gait normal . No clubbing/cyanosis of digits Neurological: . No cranial nerve deficit on limited exam . Motor and sensation intact and symmetric Psychiatric: . Normal judgment/insight . Normal mood and affect         ASSESSMENT & PLAN with other pertinent history/findings:  The primary encounter diagnosis was Annual physical exam. Diagnoses of Need for shingles vaccine, Anxiety disorder, unspecified type, Low ferritin, Hypertension goal BP (blood pressure) < 130/80, and Migraine without aura and without status migrainosus, not intractable were also pertinent to this visit.  1. Need for shingles vaccine Done today  2. Annual physical exam See below for preventive care  3. Anxiety disorder, unspecified type Doing well would like to stay on effexor tid and buspar 7.5 mg bid-tid  4. Low ferritin rechecking  5. Hypertension goal BP (blood pressure) < 130/80 Stable, checking BP at home  and at goal per patient  6. Migraine without aura and without status migrainosus, not intractable Better on verapamil, not needing triptan, excedrin migraine helps prn      Patient Instructions  General Preventive Care  Most recent routine screening lipids/other labs: ordered today!    Everyone should have blood pressure checked once per year.   Tobacco: don't! Please let me know if you need help quitting!  Alcohol: responsible moderation is ok for many adults, but not everyone - if you have concerns about your alcohol intake, please talk to me!   Exercise: as tolerated to reduce risk of cardiovascular disease and diabetes. Strength training will also prevent osteoporosis.   Mental health: if need for mental health care (medicines, counseling, other), or concerns about moods, please let me know!   Sexual health: if need for STD testing, or if concerns with libido/pain problems, please let me know!   Advanced Directive: Living Will and/or Healthcare Power of Attorney recommended for all adults, regardless of age or health.  Vaccines  Flu vaccine: recommended for almost everyone, every fall.   Shingles vaccine: Shingrix recommended after age 4. Shot #2 due 2-6 months.   Pneumonia vaccines: Prevnar and Pneumovax recommended after age 7, or sooner if certain medical conditions.  Tetanus booster: Tdap recommended every 10 years.   COVID: recommended as soon as you're eligible! Please follow [email or web address] for updates on eligibility and vaccination appointment. As of now, we do not anticipate our clinic having vaccines anytime soon.  Cancer screenings   Colon cancer screening: 03/2024  Breast cancer screening: mammogram annually after age 33.   Cervical cancer screening: Pap due 01/2023  Lung cancer screening: not needed for non-smokers  Infection screenings . HIV: recommended screening at least once age 75-65, more often  as needed. . Gonorrhea/Chlamydia: screening as  needed . Hepatitis C: recommended for anyone born 81-1965 . TB: certain at-risk populations Other  Bone Density Test: recommended for women at age 14      Orders Placed This Encounter  Procedures  . Varicella-zoster vaccine IM (Shingrix)  . CBC  . COMPLETE METABOLIC PANEL WITH GFR  . Lipid panel  . Ferritin    Meds ordered this encounter  Medications  . busPIRone (BUSPAR) 15 MG tablet    Sig: Take 0.5-1 tablets (7.5-15 mg total) by mouth 2 (two) times daily.    Dispense:  180 tablet    Refill:  3  . venlafaxine (EFFEXOR) 75 MG tablet    Sig: Take 1 tablet (75 mg total) by mouth 3 (three) times daily.    Dispense:  270 tablet    Refill:  3  . verapamil (CALAN-SR) 120 MG CR tablet    Sig: Take 1 tablet (120 mg total) by mouth at bedtime.    Dispense:  90 tablet    Refill:  3  . aspirin-acetaminophen-caffeine (EXCEDRIN MIGRAINE) 250-250-65 MG tablet    Sig: Take 1-2 tablets by mouth every 6 (six) hours as needed for headache.    Dispense:  30 tablet    Refill:  5       Follow-up instructions: Return in about 1 year (around 07/30/2020) for [place] (call week prior to visit for lab orders).                                         BP 136/86 (BP Location: Left Arm, Patient Position: Sitting, Cuff Size: Normal)   Pulse 71   Temp 98.2 F (36.8 C) (Oral)   Wt 134 lb (60.8 kg)   BMI 23.00 kg/m   Current Meds  Medication Sig  . busPIRone (BUSPAR) 15 MG tablet Take 0.5-1 tablets (7.5-15 mg total) by mouth 2 (two) times daily.  . Calcium Carbonate-Vitamin D3 600-400 MG-UNIT TABS Take 1 tablet by mouth 2 (two) times daily.  Marland Kitchen docusate sodium (COLACE) 100 MG capsule Take 100 mg by mouth 2 (two) times daily as needed for constipation.  Marland Kitchen venlafaxine (EFFEXOR) 75 MG tablet Take 1 tablet (75 mg total) by mouth 3 (three) times daily.  . verapamil (CALAN-SR) 120 MG CR tablet Take 1 tablet (120 mg total) by mouth at bedtime.  .  [DISCONTINUED] busPIRone (BUSPAR) 7.5 MG tablet Take 1 tablet (7.5 mg total) by mouth 2 (two) times daily. May take 1 additional dose prn  . [DISCONTINUED] venlafaxine (EFFEXOR) 75 MG tablet TAKE 1 TABLET (75 MG TOTAL) BY MOUTH 3 (THREE) TIMES DAILY.  . [DISCONTINUED] verapamil (CALAN-SR) 120 MG CR tablet Take 1 tablet (120 mg total) by mouth at bedtime. Needs new PCP   Social History   Tobacco Use  Smoking Status Never Smoker  Smokeless Tobacco Never Used   Past Surgical History:  Procedure Laterality Date  . BREAST BIOPSY Left 2008   all findings benign   . COLONOSCOPY  2015   normal per patient  . HAND TENDON SURGERY Right 1997  . HERNIA REPAIR  02/2015  . REFRACTIVE SURGERY    . TUBAL LIGATION    . UPPER GASTROINTESTINAL ENDOSCOPY     Immunization History  Administered Date(s) Administered  . Influenza,inj,Quad PF,6+ Mos 01/10/2018, 01/31/2019  . Zoster Recombinat (Shingrix) 07/31/2019      No results  found for this or any previous visit (from the past 7 hour(s)).  No results found.  Depression screen [facility] 2/9 07/31/2019 01/31/2019 04/15/2018  Decreased Interest 1 1 1   Down, Depressed, Hopeless 1 1 1   PHQ - 2 Score 2 2 2   Altered sleeping 1 0 2  Tired, decreased energy 1 1 1   Change in appetite 0 1 0  Feeling bad or failure about yourself  2 1 1   Trouble concentrating 0 0 0  Moving slowly or fidgety/restless 0 0 0  Suicidal thoughts 0 0 0  PHQ-9 Score 6 5 6   Difficult doing work/chores - Somewhat difficult Not difficult at all    GAD 7 : Generalized Anxiety Score 07/31/2019 01/31/2019 04/15/2018  Nervous, Anxious, on Edge 1 1 2   Control/stop worrying 0 0 0  Worry too much - different things 0 0 0  Trouble relaxing 0 0 0  Restless 0 0 0  Easily annoyed or irritable 1 1 2   Afraid - awful might happen 1 0 0  Total GAD 7 Score 3 2 4   Anxiety Difficulty - Not difficult at all -      All questions at time of visit were answered - patient instructed to contact  office with any additional concerns or updates.  ER/RTC precautions were reviewed with the patient.  Please note: voice recognition software was used to produce this document, and typos may escape review. Please contact Dr. Sheppard Coil for any needed clarifications.

## 2019-08-12 LAB — CBC
HCT: 36.6 % (ref 35.0–45.0)
Hemoglobin: 11.4 g/dL — ABNORMAL LOW (ref 11.7–15.5)
MCH: 25.9 pg — ABNORMAL LOW (ref 27.0–33.0)
MCHC: 31.1 g/dL — ABNORMAL LOW (ref 32.0–36.0)
MCV: 83 fL (ref 80.0–100.0)
MPV: 11.9 fL (ref 7.5–12.5)
Platelets: 342 10*3/uL (ref 140–400)
RBC: 4.41 10*6/uL (ref 3.80–5.10)
RDW: 12.8 % (ref 11.0–15.0)
WBC: 3.9 10*3/uL (ref 3.8–10.8)

## 2019-08-12 LAB — COMPLETE METABOLIC PANEL WITH GFR
AG Ratio: 1.5 (calc) (ref 1.0–2.5)
ALT: 10 U/L (ref 6–29)
AST: 10 U/L (ref 10–35)
Albumin: 4 g/dL (ref 3.6–5.1)
Alkaline phosphatase (APISO): 55 U/L (ref 37–153)
BUN: 18 mg/dL (ref 7–25)
CO2: 30 mmol/L (ref 20–32)
Calcium: 9.2 mg/dL (ref 8.6–10.4)
Chloride: 105 mmol/L (ref 98–110)
Creat: 0.75 mg/dL (ref 0.50–1.05)
GFR, Est African American: 105 mL/min/{1.73_m2} (ref >=60)
GFR, Est Non African American: 91 mL/min/{1.73_m2} (ref >=60)
Globulin: 2.6 g/dL (calc) (ref 1.9–3.7)
Glucose, Bld: 86 mg/dL (ref 65–99)
Potassium: 4.3 mmol/L (ref 3.5–5.3)
Sodium: 140 mmol/L (ref 135–146)
Total Bilirubin: 0.3 mg/dL (ref 0.2–1.2)
Total Protein: 6.6 g/dL (ref 6.1–8.1)

## 2019-08-12 LAB — LIPID PANEL
Cholesterol: 213 mg/dL — ABNORMAL HIGH (ref <200)
HDL: 67 mg/dL (ref >=50)
LDL Cholesterol (Calc): 131 mg/dL (calc) — ABNORMAL HIGH
Non-HDL Cholesterol (Calc): 146 mg/dL (calc) — ABNORMAL HIGH (ref <130)
Total CHOL/HDL Ratio: 3.2 (calc) (ref <5.0)
Triglycerides: 54 mg/dL (ref <150)

## 2019-08-12 LAB — FERRITIN: Ferritin: 3 ng/mL — ABNORMAL LOW (ref 16–232)

## 2019-08-27 DIAGNOSIS — M20021 Boutonniere deformity of right finger(s): Secondary | ICD-10-CM | POA: Diagnosis not present

## 2019-08-27 DIAGNOSIS — M720 Palmar fascial fibromatosis [Dupuytren]: Secondary | ICD-10-CM | POA: Diagnosis not present

## 2019-09-25 DIAGNOSIS — M720 Palmar fascial fibromatosis [Dupuytren]: Secondary | ICD-10-CM | POA: Diagnosis not present

## 2019-09-25 DIAGNOSIS — Z01812 Encounter for preprocedural laboratory examination: Secondary | ICD-10-CM | POA: Diagnosis not present

## 2019-09-25 DIAGNOSIS — Z20822 Contact with and (suspected) exposure to covid-19: Secondary | ICD-10-CM | POA: Diagnosis not present

## 2019-09-30 DIAGNOSIS — G8918 Other acute postprocedural pain: Secondary | ICD-10-CM | POA: Diagnosis not present

## 2019-09-30 DIAGNOSIS — M20021 Boutonniere deformity of right finger(s): Secondary | ICD-10-CM | POA: Diagnosis not present

## 2019-09-30 DIAGNOSIS — M720 Palmar fascial fibromatosis [Dupuytren]: Secondary | ICD-10-CM | POA: Diagnosis not present

## 2019-09-30 DIAGNOSIS — L905 Scar conditions and fibrosis of skin: Secondary | ICD-10-CM | POA: Diagnosis not present

## 2019-10-24 DIAGNOSIS — M20021 Boutonniere deformity of right finger(s): Secondary | ICD-10-CM | POA: Diagnosis not present

## 2019-10-24 DIAGNOSIS — M720 Palmar fascial fibromatosis [Dupuytren]: Secondary | ICD-10-CM | POA: Diagnosis not present

## 2019-10-24 DIAGNOSIS — M25641 Stiffness of right hand, not elsewhere classified: Secondary | ICD-10-CM | POA: Diagnosis not present

## 2019-10-27 ENCOUNTER — Encounter: Payer: Self-pay | Admitting: Osteopathic Medicine

## 2019-10-31 DIAGNOSIS — M79644 Pain in right finger(s): Secondary | ICD-10-CM | POA: Diagnosis not present

## 2019-10-31 DIAGNOSIS — M25641 Stiffness of right hand, not elsewhere classified: Secondary | ICD-10-CM | POA: Diagnosis not present

## 2019-10-31 DIAGNOSIS — M720 Palmar fascial fibromatosis [Dupuytren]: Secondary | ICD-10-CM | POA: Diagnosis not present

## 2019-11-06 DIAGNOSIS — M79644 Pain in right finger(s): Secondary | ICD-10-CM | POA: Diagnosis not present

## 2019-11-13 DIAGNOSIS — M25641 Stiffness of right hand, not elsewhere classified: Secondary | ICD-10-CM | POA: Diagnosis not present

## 2019-11-13 DIAGNOSIS — M20021 Boutonniere deformity of right finger(s): Secondary | ICD-10-CM | POA: Diagnosis not present

## 2019-11-13 DIAGNOSIS — M720 Palmar fascial fibromatosis [Dupuytren]: Secondary | ICD-10-CM | POA: Diagnosis not present

## 2019-11-13 DIAGNOSIS — M79644 Pain in right finger(s): Secondary | ICD-10-CM | POA: Diagnosis not present

## 2019-11-18 ENCOUNTER — Encounter: Payer: Self-pay | Admitting: Osteopathic Medicine

## 2019-11-21 DIAGNOSIS — M20021 Boutonniere deformity of right finger(s): Secondary | ICD-10-CM | POA: Diagnosis not present

## 2019-11-21 DIAGNOSIS — M720 Palmar fascial fibromatosis [Dupuytren]: Secondary | ICD-10-CM | POA: Diagnosis not present

## 2019-11-21 DIAGNOSIS — M79644 Pain in right finger(s): Secondary | ICD-10-CM | POA: Diagnosis not present

## 2019-11-21 DIAGNOSIS — M25641 Stiffness of right hand, not elsewhere classified: Secondary | ICD-10-CM | POA: Diagnosis not present

## 2019-11-28 DIAGNOSIS — M79644 Pain in right finger(s): Secondary | ICD-10-CM | POA: Diagnosis not present

## 2019-11-28 DIAGNOSIS — M25641 Stiffness of right hand, not elsewhere classified: Secondary | ICD-10-CM | POA: Diagnosis not present

## 2019-11-28 DIAGNOSIS — M720 Palmar fascial fibromatosis [Dupuytren]: Secondary | ICD-10-CM | POA: Diagnosis not present

## 2019-11-28 DIAGNOSIS — M20021 Boutonniere deformity of right finger(s): Secondary | ICD-10-CM | POA: Diagnosis not present

## 2019-12-24 DIAGNOSIS — M79672 Pain in left foot: Secondary | ICD-10-CM | POA: Diagnosis not present

## 2019-12-24 DIAGNOSIS — M79671 Pain in right foot: Secondary | ICD-10-CM | POA: Diagnosis not present

## 2019-12-24 DIAGNOSIS — M722 Plantar fascial fibromatosis: Secondary | ICD-10-CM | POA: Insufficient documentation

## 2019-12-26 ENCOUNTER — Encounter: Payer: Self-pay | Admitting: Osteopathic Medicine

## 2020-01-08 ENCOUNTER — Encounter: Payer: Self-pay | Admitting: Osteopathic Medicine

## 2020-01-08 NOTE — Telephone Encounter (Signed)
Dr. Christie Nottingham in [place] does Family counseling Fritzi Mandes is real good she is however booking in October right now so she ask that the patient text her directly at [phone] and she will get them scheduled. - CF

## 2020-03-25 ENCOUNTER — Ambulatory Visit: Payer: BC Managed Care – PPO

## 2020-03-29 ENCOUNTER — Other Ambulatory Visit: Payer: Self-pay

## 2020-03-29 ENCOUNTER — Ambulatory Visit (INDEPENDENT_AMBULATORY_CARE_PROVIDER_SITE_OTHER): Payer: BC Managed Care – PPO | Admitting: Osteopathic Medicine

## 2020-03-29 ENCOUNTER — Other Ambulatory Visit: Payer: Self-pay | Admitting: Osteopathic Medicine

## 2020-03-29 VITALS — Temp 98.0°F

## 2020-03-29 DIAGNOSIS — Z23 Encounter for immunization: Secondary | ICD-10-CM | POA: Diagnosis not present

## 2020-03-29 DIAGNOSIS — Z1231 Encounter for screening mammogram for malignant neoplasm of breast: Secondary | ICD-10-CM

## 2020-03-29 NOTE — Progress Notes (Signed)
Patient reports to office for second Shingrix shot and for flu shot. Denies any recent fevers.  Patient tolerated both injections well without any immediate complications.   Shingrix given in L deltoid, flu shot given in R deltoid.

## 2020-04-03 IMAGING — MG DIGITAL SCREENING BILAT W/ TOMO W/ CAD
6 of 10 series · 6 of 30 positions shown · non-contrast
Comparison: Previous exam(s).

CLINICAL DATA: Screening.

EXAM:
DIGITAL SCREENING BILATERAL MAMMOGRAM WITH TOMO AND CAD

[L CC synth-2D]
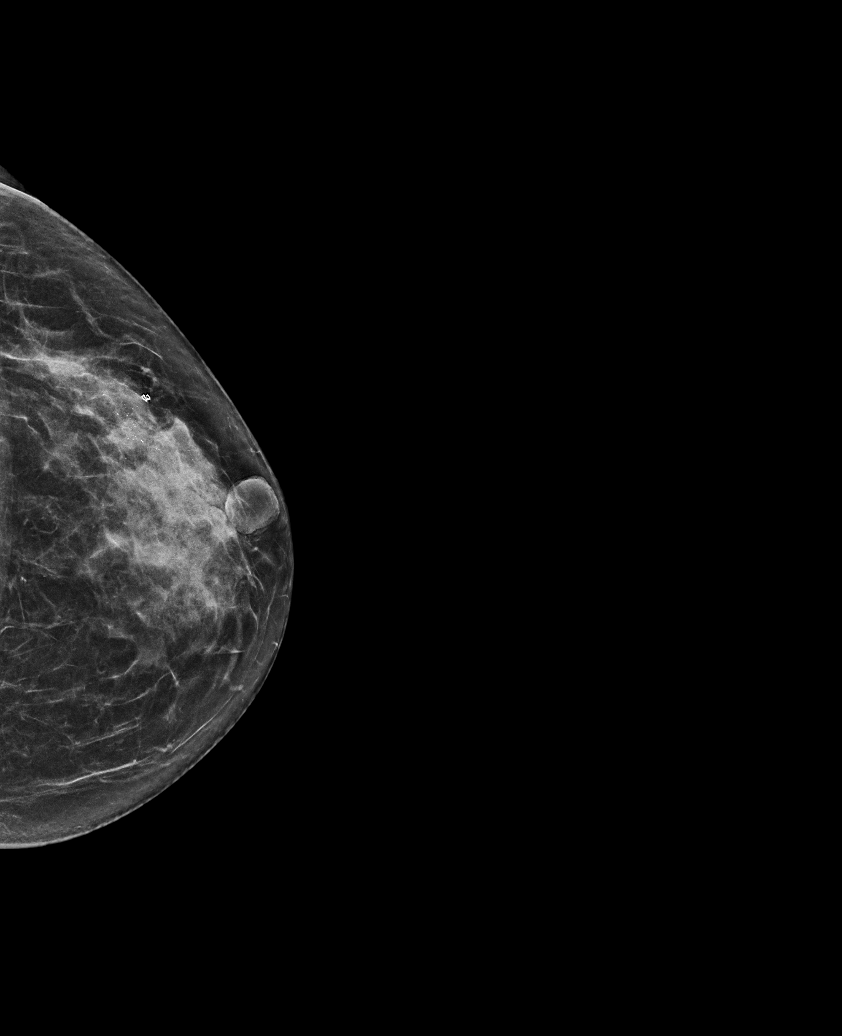

[R MLO synth-2D]
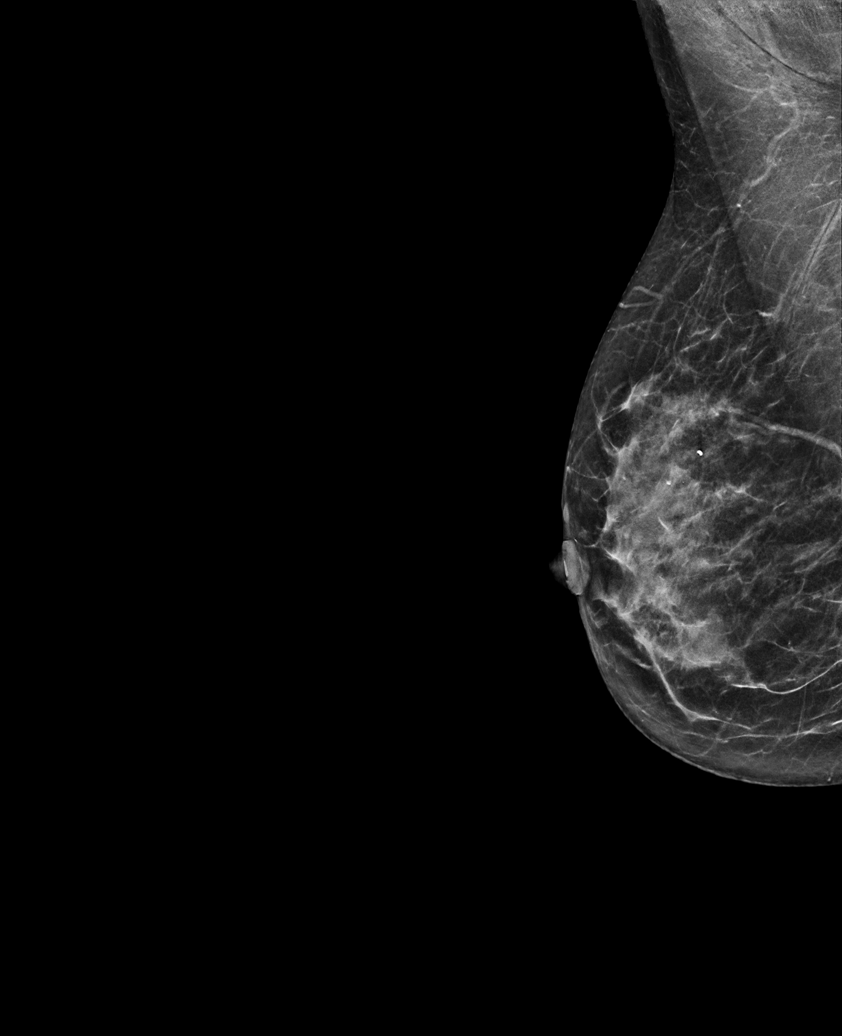

[L MLO synth-2D (1 of 2)]
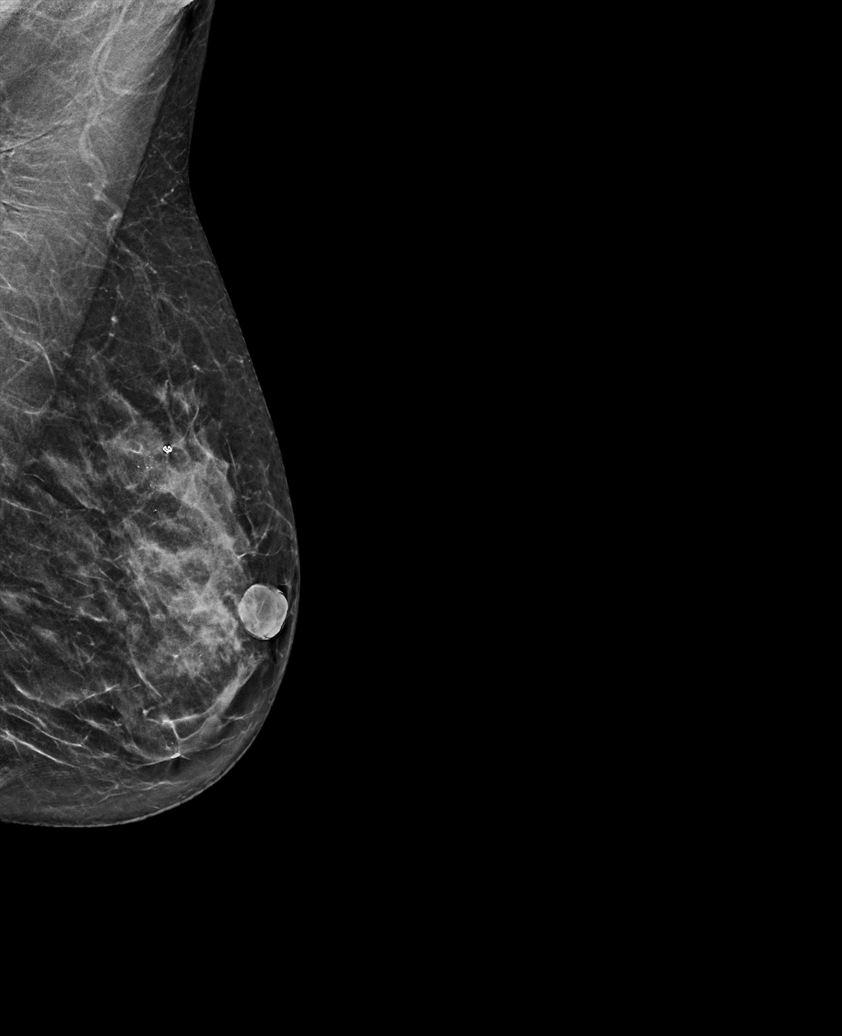

[L MLO synth-2D (2 of 2)]
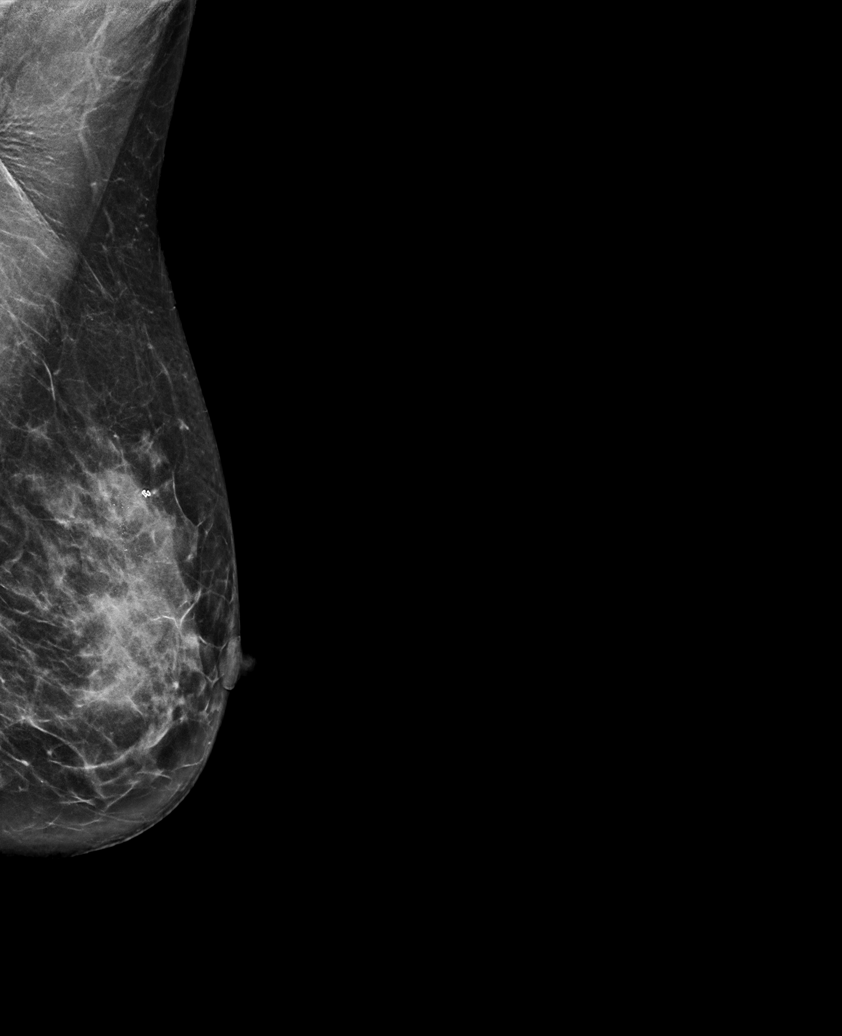

[R CC synth-2D]
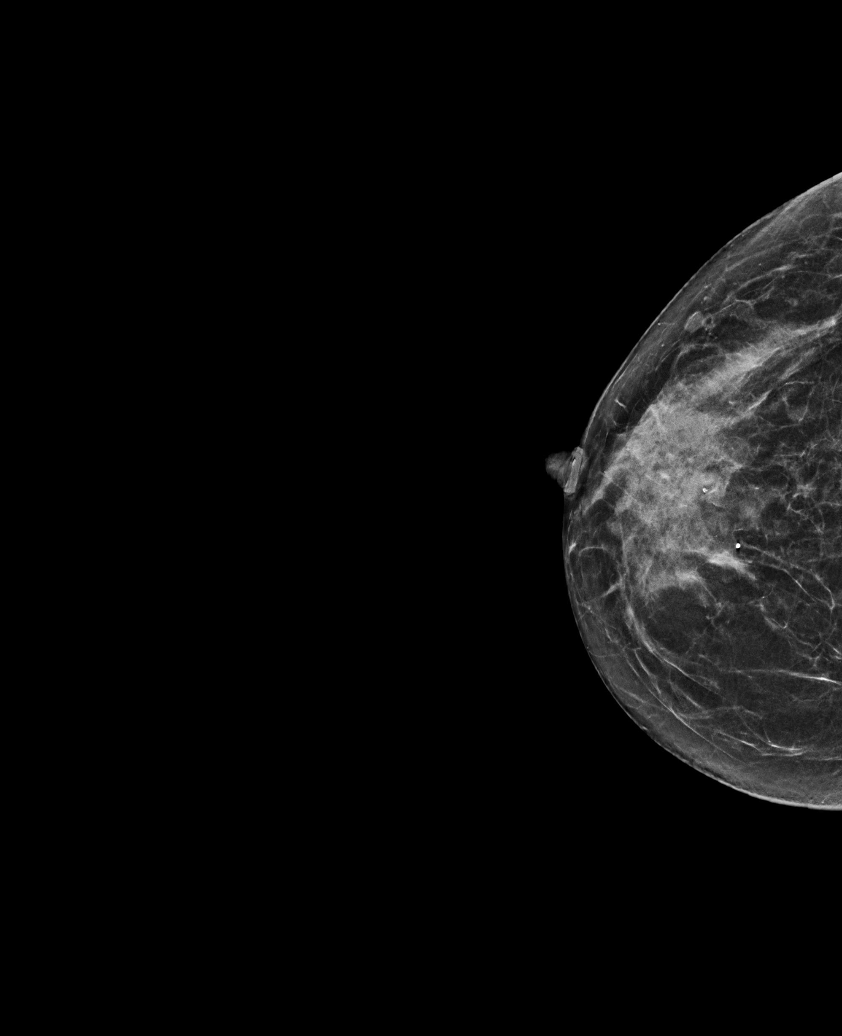

[L MLO tomo · tomo slice 31/61.0]
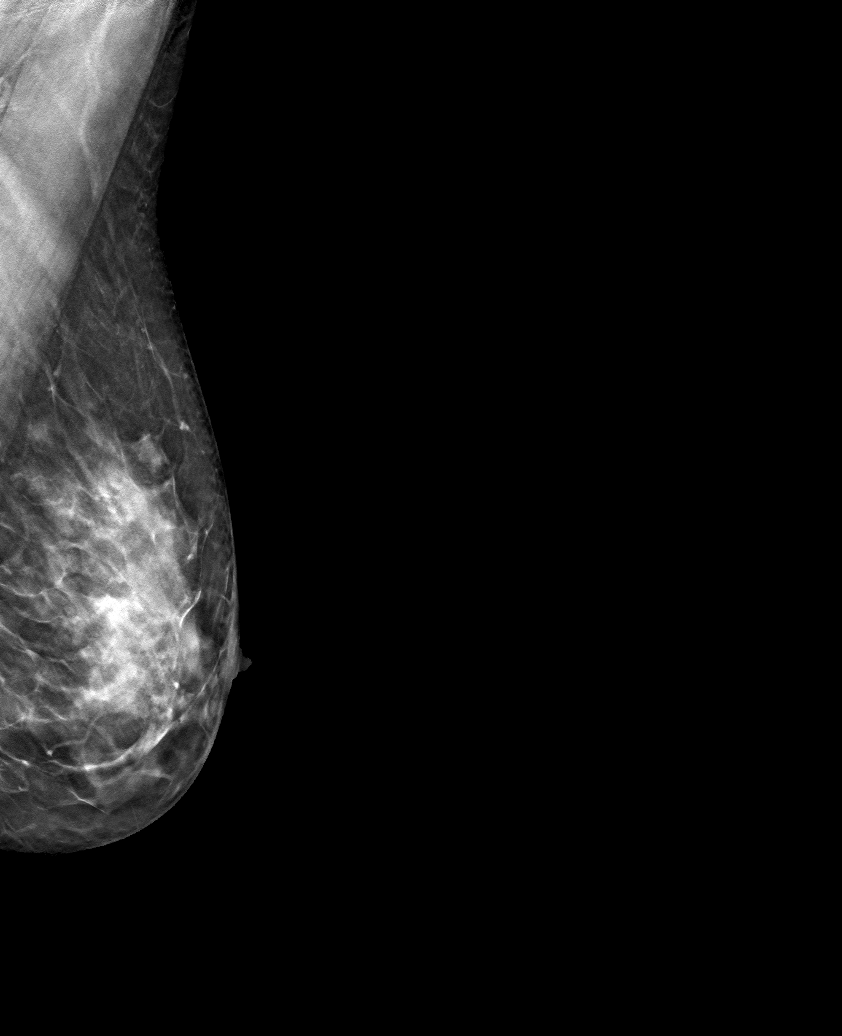

[6 of 30 positions shown; findings below may reference images not displayed]

ACR Breast Density Category c: The breast tissue is heterogeneously
dense, which may obscure small masses.
FINDINGS: There are no findings suspicious for malignancy. Images were
processed with CAD.
IMPRESSION: No mammographic evidence of malignancy. A result letter of this
screening mammogram will be mailed directly to the patient.

RECOMMENDATION:
Screening mammogram in one year. (Code:[id number]-U-LHB)

BI-RADS CATEGORY  1: Negative.

## 2020-05-26 ENCOUNTER — Other Ambulatory Visit: Payer: Self-pay

## 2020-05-26 ENCOUNTER — Ambulatory Visit: Payer: BC Managed Care – PPO

## 2020-06-16 ENCOUNTER — Ambulatory Visit (INDEPENDENT_AMBULATORY_CARE_PROVIDER_SITE_OTHER): Payer: BC Managed Care – PPO

## 2020-06-16 ENCOUNTER — Other Ambulatory Visit: Payer: Self-pay

## 2020-06-16 DIAGNOSIS — Z1231 Encounter for screening mammogram for malignant neoplasm of breast: Secondary | ICD-10-CM

## 2020-06-22 ENCOUNTER — Ambulatory Visit (INDEPENDENT_AMBULATORY_CARE_PROVIDER_SITE_OTHER): Payer: BC Managed Care – PPO | Admitting: Osteopathic Medicine

## 2020-06-22 ENCOUNTER — Encounter: Payer: Self-pay | Admitting: Osteopathic Medicine

## 2020-06-22 ENCOUNTER — Other Ambulatory Visit: Payer: Self-pay

## 2020-06-22 VITALS — BP 129/84 | HR 75 | Temp 97.8°F | Wt 143.0 lb

## 2020-06-22 DIAGNOSIS — I10 Essential (primary) hypertension: Secondary | ICD-10-CM | POA: Diagnosis not present

## 2020-06-22 DIAGNOSIS — Z0000 Encounter for general adult medical examination without abnormal findings: Secondary | ICD-10-CM

## 2020-06-22 MED ORDER — VERAPAMIL HCL ER 180 MG PO TBCR: 180.0000 mg | EXTENDED_RELEASE_TABLET | Freq: Every day | ORAL | 1 refills | Status: DC

## 2020-06-22 NOTE — Patient Instructions (Signed)
Increasing Verapamil form 120 mg to 180 mg   Meds ordered this encounter  Medications  . verapamil (CALAN-SR) 180 MG CR tablet    Sig: Take 1 tablet (180 mg total) by mouth at bedtime.    Dispense:  90 tablet    Refill:  1    Replaces 120 mg dose

## 2020-06-22 NOTE — Progress Notes (Signed)
HPI: Charlene Bell is a 55 y.o. female who  has a past medical history of Anxiety, Depression, Dupuytren's contracture of right hand, Hypertension, Iron deficiency anemia (04/15/2018), Left breast mass, Microcytic anemia (02/01/2018), and Perimenopause.  she presents to [facility] today, 06/22/20,  for chief complaint of:  Follow up BP . Current meds: verapamil 120 mg daily for HTN and migraine prevention BP Readings from Last 3 Encounters:  06/22/20 129/84  07/31/19 136/86  03/17/19 124/60  . Pt notes slowly increasing blood pressures over last 3 months. Average bp previously 120s/70s, now 130s/70-80s. Over this same time frame she has had increased frequency of headaches. Headaches are dull ache located on L temple, with no radiation, no associated N/V/D or visual changes. Headaches do not wake her from sleep, but she does have them on waking. They are responsive to OTC analgesics. Pt does have a history of migraines, but states these headaches are different.  . Pt does note that over the same same frame as the symptoms described above, she had decreased exercise and was not eating as healthy.  . Denies: chest pain, shortness of breath, heart palpitations, dizziness   Past medical, surgical, social and family history reviewed:  Patient Active Problem List   Diagnosis Date Noted  . Anxiety disorder 10/14/2018  . Family history of cerebral aneurysm 10/02/2018  . History of iron deficiency 10/02/2018  . Low ferritin 10/02/2018  . Hypertension goal BP (blood pressure) < 130/80 04/15/2018  . Migraine without aura and without status migrainosus, not intractable 04/15/2018  . Leukopenia 02/01/2018  . Mixed hyperlipidemia 02/01/2018  . Dysthymia 01/10/2018  . Family history of colon cancer in father 01/10/2018  . Dupuytren's contracture of right hand     Past Surgical History:  Procedure Laterality Date  . BREAST BIOPSY Left 2008   all findings benign    . COLONOSCOPY  2015   normal per patient  . HAND TENDON SURGERY Right 1997  . HERNIA REPAIR  02/2015  . REFRACTIVE SURGERY    . TUBAL LIGATION    . UPPER GASTROINTESTINAL ENDOSCOPY      Social History   Tobacco Use  . Smoking status: Never Smoker  . Smokeless tobacco: Never Used  Substance Use Topics  . Alcohol use: Not Currently    Alcohol/week: 14.0 - 21.0 standard drinks    Types: 14 - 21 Standard drinks or equivalent per week    Comment: last drink 02/03/18    Family History  Problem Relation Age of Onset  . Dementia Sister   . Aneurysm Sister   . CVA Sister   . Heart attack Mother   . Colon cancer Father   . Heart failure Father   . Esophageal cancer Neg Hx   . Rectal cancer Neg Hx   . Stomach cancer Neg Hx      Current medication list and allergy/intolerance information reviewed:    Current Outpatient Medications  Medication Sig Dispense Refill  . aspirin-acetaminophen-caffeine (EXCEDRIN MIGRAINE) 250-250-65 MG tablet Take 1-2 tablets by mouth every 6 (six) hours as needed for headache. 30 tablet 5  . busPIRone (BUSPAR) 15 MG tablet Take 0.5-1 tablets (7.5-15 mg total) by mouth 2 (two) times daily. 180 tablet 3  . Calcium Carbonate-Vitamin D3 600-400 MG-UNIT TABS Take 1 tablet by mouth 2 (two) times daily.    Marland Kitchen docusate sodium (COLACE) 100 MG capsule Take 100 mg by mouth 2 (two) times daily as needed for constipation.    Marland Kitchen  venlafaxine (EFFEXOR) 75 MG tablet Take 1 tablet (75 mg total) by mouth 3 (three) times daily. 270 tablet 3  . verapamil (CALAN-SR) 180 MG CR tablet Take 1 tablet (180 mg total) by mouth at bedtime. 90 tablet 1   No current facility-administered medications for this visit.    No Known Allergies    Review of Systems:  Constitutional: No significant fatigue.   HEENT: +  headache, no vision change  Cardiac: No  chest pain, No  pressure, No palpitations  Respiratory:  No  shortness of breath.  Gastrointestinal: No  abdominal pain, No   nausea, No  vomiting, No  diarrhea, + mild constipation (controlled w/ OTC meds)  Endocrine: No cold intolerance,  No heat intolerance.  Neurologic: No  weakness, No  dizziness  Psychiatric:, No sleep problems  Exam:  BP 129/84 (BP Location: Left Arm, Patient Position: Sitting, Cuff Size: Normal)   Pulse 75   Temp 97.8 F (36.6 C) (Oral)   Wt 143 lb 0.6 oz (64.9 kg)   BMI 24.55 kg/m   Constitutional: VS see above. General Appearance: alert, well-developed, well-nourished, NAD  Eyes: Normal lids and conjunctive, non-icteric sclera  Respiratory: Normal respiratory effort. no wheeze, no rhonchi, no rales  Cardiovascular: S1/S2 normal, no murmur, no rub/gallop auscultated. RRR. No lower extremity edema.   Musculoskeletal: Gait normal. No clubbing/cyanosis of digits.   Neurological: Normal balance/coordination. No tremor. No cranial nerve deficit on limited exam.   Skin: warm, dry, intact. No rash/ulcer.     Psychiatric: Normal judgment/insight. Normal mood and affect.    No results found for this or any previous visit (from the past 72 hour(s)).  No results found.   ASSESSMENT/PLAN: The primary encounter diagnosis was Hypertension goal BP (blood pressure) < 130/80. A diagnosis of Annual physical exam was also pertinent to this visit.  1. Hypertension goal BP (blood pressure) < 130/80  Increase verapamil from 120 mg to 180 mg daily  Follow up in one 6 weeks in March for annual physical and bp recheck   MyChart message set for pt to send me BP readings in 2 weeks   2. Annual physical exam  CBC, CMP, lipid panel and TSH ordered and to be completed a few days prior to follow up in March    Labs ordered for future visit. Annual physical / preventive care was NOT performed or billed today.   Orders Placed This Encounter  Procedures  . CBC  . COMPLETE METABOLIC PANEL WITH GFR  . Lipid panel  . TSH    Meds ordered this encounter  Medications  . verapamil  (CALAN-SR) 180 MG CR tablet    Sig: Take 1 tablet (180 mg total) by mouth at bedtime.    Dispense:  90 tablet    Refill:  1    Replaces 120 mg dose    Patient Instructions   Increasing Verapamil form 120 mg to 180 mg   Meds ordered this encounter  Medications  . verapamil (CALAN-SR) 180 MG CR tablet    Sig: Take 1 tablet (180 mg total) by mouth at bedtime.    Dispense:  90 tablet    Refill:  1    Replaces 120 mg dose          Visit summary with medication list and pertinent instructions was printed for patient to review. All questions at time of visit were answered - patient instructed to contact office with any additional concerns or updates. ER/RTC precautions were reviewed  with the patient.     Please note: voice recognition software was used to produce this document, and typos may escape review. Please contact Dr. Lyn Hollingshead for any needed clarifications.     Follow-up plan: Return in about 6 weeks (around 08/03/2020) for ANNUAL (labs 2+ days prior to appt, orders are in).

## 2020-07-14 DIAGNOSIS — M720 Palmar fascial fibromatosis [Dupuytren]: Secondary | ICD-10-CM | POA: Diagnosis not present

## 2020-08-05 ENCOUNTER — Other Ambulatory Visit: Payer: Self-pay | Admitting: Osteopathic Medicine

## 2020-08-06 ENCOUNTER — Other Ambulatory Visit: Payer: Self-pay | Admitting: Osteopathic Medicine

## 2020-09-04 ENCOUNTER — Other Ambulatory Visit: Payer: Self-pay | Admitting: Osteopathic Medicine

## 2020-09-07 ENCOUNTER — Ambulatory Visit (INDEPENDENT_AMBULATORY_CARE_PROVIDER_SITE_OTHER): Payer: BC Managed Care – PPO | Admitting: Sports Medicine

## 2020-09-07 ENCOUNTER — Ambulatory Visit (INDEPENDENT_AMBULATORY_CARE_PROVIDER_SITE_OTHER): Payer: BC Managed Care – PPO

## 2020-09-07 ENCOUNTER — Other Ambulatory Visit: Payer: Self-pay

## 2020-09-07 DIAGNOSIS — M545 Low back pain, unspecified: Secondary | ICD-10-CM

## 2020-09-07 MED ORDER — PREDNISONE 50 MG PO TABS: ORAL_TABLET | ORAL | 0 refills | Status: DC

## 2020-09-07 NOTE — Assessment & Plan Note (Signed)
This is a very pleasant 55 year old female, she is active and exercises frequently. Unfortunately she developed pain in her low back, worse with sitting, flexion, Valsalva. No reproducible tenderness on palpation. This is likely degenerative disc disease, we talked about the anatomy and evolution of our spine and how it relates to disc disease, we will do home rehab exercises, 5 days of prednisone, x-rays. No red flag symptoms or signs per Return to see me in 6 weeks, MRI +/- aggressive formal physical therapy at that juncture if not better.

## 2020-09-07 NOTE — Progress Notes (Signed)
    Procedures performed today:    None.  Independent interpretation of notes and tests performed by another provider:   None.  Brief History, Exam, Impression, and Recommendations:    Acute low back pain This is a very pleasant 55 year old female, she is active and exercises frequently. Unfortunately she developed pain in her low back, worse with sitting, flexion, Valsalva. No reproducible tenderness on palpation. This is likely degenerative disc disease, we talked about the anatomy and evolution of our spine and how it relates to disc disease, we will do home rehab exercises, 5 days of prednisone, x-rays. No red flag symptoms or signs per Return to see me in 6 weeks, MRI +/- aggressive formal physical therapy at that juncture if not better.    ___________________________________________ Ihor Austin. Benjamin Stain, M.D., ABFM., CAQSM. Primary Care and Sports Medicine [facility]  Adjunct Instructor of Family Medicine  [employer]

## 2020-10-19 ENCOUNTER — Ambulatory Visit: Payer: BC Managed Care – PPO | Admitting: Sports Medicine

## 2020-11-01 ENCOUNTER — Other Ambulatory Visit: Payer: Self-pay | Admitting: Osteopathic Medicine

## 2020-12-21 ENCOUNTER — Other Ambulatory Visit: Payer: Self-pay | Admitting: Osteopathic Medicine

## 2021-03-02 ENCOUNTER — Ambulatory Visit: Payer: BC Managed Care – PPO

## 2021-03-22 ENCOUNTER — Other Ambulatory Visit: Payer: Self-pay

## 2021-03-22 ENCOUNTER — Encounter: Payer: Self-pay | Admitting: Family Medicine

## 2021-03-22 ENCOUNTER — Ambulatory Visit (INDEPENDENT_AMBULATORY_CARE_PROVIDER_SITE_OTHER): Payer: BC Managed Care – PPO | Admitting: Family Medicine

## 2021-03-22 VITALS — BP 137/78 | HR 81 | Temp 97.8°F | Wt 157.0 lb

## 2021-03-22 DIAGNOSIS — I10 Essential (primary) hypertension: Secondary | ICD-10-CM | POA: Diagnosis not present

## 2021-03-22 MED ORDER — VERAPAMIL HCL ER 180 MG PO TBCR: 180.0000 mg | EXTENDED_RELEASE_TABLET | Freq: Every day | ORAL | 3 refills | Status: DC

## 2021-03-22 NOTE — Patient Instructions (Signed)
Blood pressure is at goal at for age and co-morbidities.  I recommend continuing current medications.  In addition: - BP goal <130/80 - monitor and log blood pressures at home - check around the same time each day in a relaxed setting - Limit salt to <2000 mg/day - Follow DASH eating plan (heart healthy diet) - limit alcohol to 2 standard drinks per day for men and 1 per day for women - avoid tobacco products - get at least 2 hours of regular aerobic exercise weekly Patient aware of signs/symptoms requiring further/urgent evaluation. Patient planning to come back tomorrow to complete labs that are still active from Feb (CBC, CMP, TSH, Lipid panel) Keep upcoming physical exam/establish care with Joy in January 

## 2021-03-22 NOTE — Assessment & Plan Note (Signed)
Blood pressure is at goal at for age and co-morbidities.  I recommend continuing current medications.  In addition: - BP goal <130/80 - monitor and log blood pressures at home - check around the same time each day in a relaxed setting - Limit salt to <2000 mg/day - Follow DASH eating plan (heart healthy diet) - limit alcohol to 2 standard drinks per day for men and 1 per day for women - avoid tobacco products - get at least 2 hours of regular aerobic exercise weekly Patient aware of signs/symptoms requiring further/urgent evaluation. Patient planning to come back tomorrow to complete labs that are still active from Feb (CBC, CMP, TSH, Lipid panel) Keep upcoming physical exam/establish care with Joy in January

## 2021-03-22 NOTE — Progress Notes (Signed)
Established Patient Office Visit  Subjective:  Patient ID: Charlene Bell, female    DOB: 1965/08/28  Age: 55 y.o. MRN: [id number]  CC:  Chief Complaint  Patient presents with   Medication Refill    HPI Charlene Bell presents for HTN follow-up and medication refill. No acute concerns today.  HYPERTENSION: - Medications: verapamil - Compliance: great - Checking BP at home: not recently, previously was normal - Denies any SOB, recurrent headaches, CP, vision changes, LE edema, dizziness, palpitations, or medication side effects. - Diet: tries to eat healthy meals/snacks; low-sodium - Exercise: 45-60 minutes daily - cardio and yoga - Stressors: nothing new    Past Medical History:  Diagnosis Date   Anxiety    Depression    Dupuytren's contracture of right hand    Hypertension    Iron deficiency anemia 04/15/2018   Left breast mass    Microcytic anemia 02/01/2018   Perimenopause     Past Surgical History:  Procedure Laterality Date   BREAST BIOPSY Left 2008   all findings benign    COLONOSCOPY  2015   normal per patient   HAND TENDON SURGERY Right 1997   HERNIA REPAIR  02/2015   REFRACTIVE SURGERY     TUBAL LIGATION     UPPER GASTROINTESTINAL ENDOSCOPY      Family History  Problem Relation Age of Onset   Dementia Sister    Aneurysm Sister    CVA Sister    Heart attack Mother    Colon cancer Father    Heart failure Father    Esophageal cancer Neg Hx    Rectal cancer Neg Hx    Stomach cancer Neg Hx     Social History   Socioeconomic History   Marital status: Married    Spouse name: Not on file   Number of children: Not on file   Years of education: Not on file   Highest education level: Not on file  Occupational History   Not on file  Tobacco Use   Smoking status: Never   Smokeless tobacco: Never  Vaping Use   Vaping Use: Never used  Substance and Sexual Activity   Alcohol use: Not Currently    Alcohol/week: 14.0 - 21.0 standard drinks    Types: 14  - 21 Standard drinks or equivalent per week    Comment: last drink 02/03/18   Drug use: Never   Sexual activity: Yes    Partners: Female    Birth control/protection: None  Other Topics Concern   Not on file  Social History Narrative   Not on file   Social Determinants of Health   Financial Resource Strain: Not on file  Food Insecurity: Not on file  Transportation Needs: Not on file  Physical Activity: Not on file  Stress: Not on file  Social Connections: Not on file  Intimate Partner Violence: Not on file    Outpatient Medications Prior to Visit  Medication Sig Dispense Refill   aspirin-acetaminophen-caffeine (EXCEDRIN MIGRAINE) 250-250-65 MG tablet Take 1-2 tablets by mouth every 6 (six) hours as needed for headache. 30 tablet 5   busPIRone (BUSPAR) 15 MG tablet TAKE 0.5-1 TABLETS (7.5-15 MG TOTAL) BY MOUTH 2 (TWO) TIMES DAILY. 180 tablet 1   Calcium Carbonate-Vitamin D3 600-400 MG-UNIT TABS Take 1 tablet by mouth 2 (two) times daily.     docusate sodium (COLACE) 100 MG capsule Take 100 mg by mouth 2 (two) times daily as needed for constipation.     predniSONE (DELTASONE)  50 MG tablet One tab PO daily for 5 days. 5 tablet 0   venlafaxine (EFFEXOR) 75 MG tablet TAKE 1 TABLET (75 MG TOTAL) BY MOUTH 3 (THREE) TIMES DAILY. 180 tablet 2   verapamil (CALAN-SR) 180 MG CR tablet Take 1 tablet (180 mg total) by mouth at bedtime. NO REFILLS. NEEDS LABS/VISIT. 90 tablet 0   No facility-administered medications prior to visit.    No Known Allergies  ROS Review of Systems All review of systems negative except what is listed in the HPI    Objective:    Physical Exam Vitals reviewed.  Constitutional:      Appearance: Normal appearance.  HENT:     Head: Normocephalic and atraumatic.  Cardiovascular:     Rate and Rhythm: Normal rate and regular rhythm.     Pulses: Normal pulses.     Heart sounds: Normal heart sounds.  Pulmonary:     Effort: Pulmonary effort is normal.      Breath sounds: Normal breath sounds.  Musculoskeletal:     Cervical back: Normal range of motion and neck supple.     Right lower leg: No edema.     Left lower leg: No edema.  Skin:    General: Skin is warm and dry.  Neurological:     General: No focal deficit present.     Mental Status: She is alert and oriented to person, place, and time. Mental status is at baseline.  Psychiatric:        Mood and Affect: Mood normal.        Behavior: Behavior normal.        Thought Content: Thought content normal.        Judgment: Judgment normal.       BP 137/78 (BP Location: Left Arm, Patient Position: Sitting, Cuff Size: Normal)   Pulse 81   Temp 97.8 F (36.6 C) (Oral)   Wt 157 lb 0.6 oz (71.2 kg)   SpO2 100%   BMI 26.96 kg/m  Wt Readings from Last 3 Encounters:  03/22/21 157 lb 0.6 oz (71.2 kg)  06/22/20 143 lb 0.6 oz (64.9 kg)  07/31/19 134 lb (60.8 kg)     Health Maintenance Due  Topic Date Due   HIV Screening  Never done   Hepatitis C Screening  Never done    There are no preventive care reminders to display for this patient.  No results found for: TSH Lab Results  Component Value Date   WBC 3.9 08/11/2019   HGB 11.4 (L) 08/11/2019   HCT 36.6 08/11/2019   MCV 83.0 08/11/2019   PLT 342 08/11/2019   Lab Results  Component Value Date   NA 140 08/11/2019   K 4.3 08/11/2019   CO2 30 08/11/2019   GLUCOSE 86 08/11/2019   BUN 18 08/11/2019   CREATININE 0.75 08/11/2019   BILITOT 0.3 08/11/2019   AST 10 08/11/2019   ALT 10 08/11/2019   PROT 6.6 08/11/2019   CALCIUM 9.2 08/11/2019   Lab Results  Component Value Date   CHOL 213 (H) 08/11/2019   Lab Results  Component Value Date   HDL 67 08/11/2019   Lab Results  Component Value Date   LDLCALC 131 (H) 08/11/2019   Lab Results  Component Value Date   TRIG 54 08/11/2019   Lab Results  Component Value Date   CHOLHDL 3.2 08/11/2019   No results found for: HGBA1C    Assessment & Plan:   Problem List  Items Addressed  This Visit       Cardiovascular and Mediastinum   Hypertension goal BP (blood pressure) < 130/80 - Primary    Blood pressure is at goal at for age and co-morbidities.  I recommend continuing current medications.  In addition: - BP goal <130/80 - monitor and log blood pressures at home - check around the same time each day in a relaxed setting - Limit salt to <2000 mg/day - Follow DASH eating plan (heart healthy diet) - limit alcohol to 2 standard drinks per day for men and 1 per day for women - avoid tobacco products - get at least 2 hours of regular aerobic exercise weekly Patient aware of signs/symptoms requiring further/urgent evaluation. Patient planning to come back tomorrow to complete labs that are still active from Feb (CBC, CMP, TSH, Lipid panel) Keep upcoming physical exam/establish care with Joy in January      Relevant Medications   verapamil (CALAN-SR) 180 MG CR tablet    Meds ordered this encounter  Medications   verapamil (CALAN-SR) 180 MG CR tablet    Sig: Take 1 tablet (180 mg total) by mouth at bedtime. NO REFILLS. NEEDS LABS/VISIT.    Dispense:  30 tablet    Refill:  3    Follow-up: Return if symptoms worsen or fail to improve. Keep upcoming appointment for CPE.   Lollie Marrow Reola Calkins, DNP, FNP-C

## 2021-04-27 ENCOUNTER — Other Ambulatory Visit: Payer: Self-pay | Admitting: Osteopathic Medicine

## 2021-05-11 ENCOUNTER — Other Ambulatory Visit: Payer: Self-pay | Admitting: Medical-Surgical

## 2021-05-11 DIAGNOSIS — Z1231 Encounter for screening mammogram for malignant neoplasm of breast: Secondary | ICD-10-CM

## 2021-05-12 LAB — COMPLETE METABOLIC PANEL WITH GFR
AG Ratio: 1.6 (calc) (ref 1.0–2.5)
ALT: 13 U/L (ref 6–29)
AST: 12 U/L (ref 10–35)
Albumin: 4.1 g/dL (ref 3.6–5.1)
Alkaline phosphatase (APISO): 71 U/L (ref 37–153)
BUN: 14 mg/dL (ref 7–25)
CO2: 28 mmol/L (ref 20–32)
Calcium: 9.2 mg/dL (ref 8.6–10.4)
Chloride: 105 mmol/L (ref 98–110)
Creat: 0.8 mg/dL (ref 0.50–1.03)
Globulin: 2.5 g/dL (calc) (ref 1.9–3.7)
Glucose, Bld: 89 mg/dL (ref 65–99)
Potassium: 4.8 mmol/L (ref 3.5–5.3)
Sodium: 142 mmol/L (ref 135–146)
Total Bilirubin: 0.4 mg/dL (ref 0.2–1.2)
Total Protein: 6.6 g/dL (ref 6.1–8.1)
eGFR: 87 mL/min/{1.73_m2} (ref >=60)

## 2021-05-12 LAB — CBC
HCT: 41.4 % (ref 35.0–45.0)
Hemoglobin: 13.9 g/dL (ref 11.7–15.5)
MCH: 30.8 pg (ref 27.0–33.0)
MCHC: 33.6 g/dL (ref 32.0–36.0)
MCV: 91.6 fL (ref 80.0–100.0)
MPV: 11.6 fL (ref 7.5–12.5)
Platelets: 259 10*3/uL (ref 140–400)
RBC: 4.52 10*6/uL (ref 3.80–5.10)
RDW: 11.6 % (ref 11.0–15.0)
WBC: 4.2 10*3/uL (ref 3.8–10.8)

## 2021-05-12 LAB — LIPID PANEL
Cholesterol: 291 mg/dL — ABNORMAL HIGH (ref <200)
HDL: 87 mg/dL (ref >=50)
LDL Cholesterol (Calc): 186 mg/dL (calc) — ABNORMAL HIGH
Non-HDL Cholesterol (Calc): 204 mg/dL (calc) — ABNORMAL HIGH (ref <130)
Total CHOL/HDL Ratio: 3.3 (calc) (ref <5.0)
Triglycerides: 75 mg/dL (ref <150)

## 2021-05-12 LAB — TSH: TSH: 0.98 mIU/L

## 2021-05-17 NOTE — Patient Instructions (Signed)
Statin Intolerance Statin intolerance is the inability to take a certain type of cholesterol-lowering medicine (statin) because of unwanted side effects, such as muscle pain. Statins may be prescribed to decrease cholesterol levels and lower the risk for heart attack, heart disease, and stroke. People with statin intolerance may experience muscle pain and cramps (myalgia) that go away when the statin is stopped. What are the causes? The cause of this condition is not known. What increases the risk? You may be at higher risk for statin intolerance if you: Take more than one type of cholesterol-lowering medicine at a time. Need a higher-than-normal dosage of a statin. Have any of these medical problems: A history of surgery or trauma. Kidney, liver, joint, or muscle disease. A low level of hormones that control how your body uses energy (hypothyroidism). A lack of vitamin D (deficiency). Have a family history of muscle aches with or without statin therapy. Drink a lot of alcohol. Have any of these characteristics: Being older than 56 years of age. Having a body size that is smaller than normal. Being female. Being of Asian descent. Take certain medicines, including: Certain medicines for mental illness (antipsychotics). Some types of antibiotics or antifungals. Certain medicines used for blood pressure or heart disease. Medicines that reduce the activity of the body's disease-fighting system (immunosuppressants). Medicines to treat hepatitis C and HIV or AIDS (human immunodeficiency virus or acquired immunodeficiency syndrome). Follow an intense exercise program. Drink a lot of grapefruit juice. What are the signs or symptoms? Symptoms of statin intolerance include: General muscle aches, or myalgia. This may feel similar to muscle aches that are caused by the flu. Muscle pain, tenderness, cramps, or weakness (myositis). Severe muscle pain, weakness, and raised blood CK levels  (rhabdomyolysis). Symptoms usually go away when the statin is stopped. Rarely, liver damage can also occur, which can cause: Dark yellow or brown urine. Light-colored stool (feces). Loss of appetite. Pain in the upper right abdomen. Unusual weakness or fatigue. Yellowing of the skin or the white parts of the eyes (jaundice). How is this diagnosed? This condition is diagnosed based on your symptoms, your medical history, and a physical exam. You may also have blood tests. How is this treated? Your health care provider may have you stop taking the statin for a short time to see if your symptoms go away. Then your provider may restart your statin or: Change you to a different statin. Lower the dosage of your statin. Have you take your statin less often. Change you to another type of cholesterol-lowering medicine. Stop or change any medicines that might be interfering with your statin. Limit how much grapefruit juice you drink. Recommend stopping intense exercise. In most cases, statin intolerance can be managed, and you can continue to take a cholesterol-lowering medicine. Follow these instructions at home: Medicines Take your statin medicine as told by your health care provider. Do not stop taking the statin unless your health care provider tells you to stop. Take other over-the-counter and prescription medicines only as told by your health care provider. Check with your health care provider before taking any new medicines. Certain medicines can increase your risk for statin intolerance. Lifestyle If you drink alcohol: Limit how much you have to: 0-1 drink a day for women who are not pregnant. 0-2 drinks a day for men. Know how much alcohol is in a drink. In the [place] one drink equals one 12 oz bottle of beer (355 mL), one 5 oz glass of wine (148 mL), or  one 1 oz glass of hard liquor (44 mL). Do not use any products that contain nicotine or tobacco. These products include cigarettes,  chewing tobacco, and vaping devices, such as e-cigarettes. If you need help quitting, ask your health care provider. General instructions  Have blood tests to check CK levels or liver enzymes as told by your health care provider. Exercise as directed. Ask your health care provider what exercises are best for you. Do not start a new exercise program before talking about it with your health care provider. Follow instructions from your health care provider about eating or drinking restrictions. Your health care provider may recommend: Limiting the amount of grapefruit juice you drink, or not drinking it at all. Eating a diet that is low in saturated fats and high in fiber. Maintain a healthy weight with diet and exercise. Keep all follow-up visits. This is important. Contact a health care provider if: You have any symptoms of statin intolerance. Summary Statins are important medicines for decreasing your cholesterol, which may reduce your risk for heart attack and stroke. Some people are not able to continue taking a particular statin because of muscle problems (myalgia) or other side effects. Myalgia is the most common symptom of statin intolerance. Often, the muscle pain and cramps from myalgia go away when the statin is stopped. Although rare, liver damage can occur as a result of statin intolerance. You should have routine blood tests to check your liver enzymes. In most cases, statin intolerance can be managed, and you can continue to take a cholesterol-lowering medicine. This information is not intended to replace advice given to you by your health care provider. Make sure you discuss any questions you have with your health care provider. Document Revised: 07/01/2020 Document Reviewed: 07/01/2020 Elsevier Patient Education  2022 Elsevier Inc.    Preventive Care 18-84 Years Old, Female Preventive care refers to lifestyle choices and visits with your health care provider that can promote  health and wellness. Preventive care visits are also called wellness exams. What can I expect for my preventive care visit? Counseling Your health care provider may ask you questions about your: Medical history, including: Past medical problems. Family medical history. Pregnancy history. Current health, including: Menstrual cycle. Method of birth control. Emotional well-being. Home life and relationship well-being. Sexual activity and sexual health. Lifestyle, including: Alcohol, nicotine or tobacco, and drug use. Access to firearms. Diet, exercise, and sleep habits. Work and work Statistician. Sunscreen use. Safety issues such as seatbelt and bike helmet use. Physical exam Your health care provider will check your: Height and weight. These may be used to calculate your BMI (body mass index). BMI is a measurement that tells if you are at a healthy weight. Waist circumference. This measures the distance around your waistline. This measurement also tells if you are at a healthy weight and may help predict your risk of certain diseases, such as type 2 diabetes and high blood pressure. Heart rate and blood pressure. Body temperature. Skin for abnormal spots. What immunizations do I need? Vaccines are usually given at various ages, according to a schedule. Your health care provider will recommend vaccines for you based on your age, medical history, and lifestyle or other factors, such as travel or where you work. What tests do I need? Screening Your health care provider may recommend screening tests for certain conditions. This may include: Lipid and cholesterol levels. Diabetes screening. This is done by checking your blood sugar (glucose) after you have not eaten for  a while (fasting). Pelvic exam and Pap test. Hepatitis B test. Hepatitis C test. HIV (human immunodeficiency virus) test. STI (sexually transmitted infection) testing, if you are at risk. Lung cancer  screening. Colorectal cancer screening. Mammogram. Talk with your health care provider about when you should start having regular mammograms. This may depend on whether you have a family history of breast cancer. BRCA-related cancer screening. This may be done if you have a family history of breast, ovarian, tubal, or peritoneal cancers. Bone density scan. This is done to screen for osteoporosis. Talk with your health care provider about your test results, treatment options, and if necessary, the need for more tests. Follow these instructions at home: Eating and drinking  Eat a diet that includes fresh fruits and vegetables, whole grains, lean protein, and low-fat dairy products. Take vitamin and mineral supplements as recommended by your health care provider. Do not drink alcohol if: Your health care provider tells you not to drink. You are pregnant, may be pregnant, or are planning to become pregnant. If you drink alcohol: Limit how much you have to 0-1 drink a day. Know how much alcohol is in your drink. In the [place] one drink equals one 12 oz bottle of beer (355 mL), one 5 oz glass of wine (148 mL), or one 1 oz glass of hard liquor (44 mL). Lifestyle Brush your teeth every morning and night with fluoride toothpaste. Floss one time each day. Exercise for at least 30 minutes 5 or more days each week. Do not use any products that contain nicotine or tobacco. These products include cigarettes, chewing tobacco, and vaping devices, such as e-cigarettes. If you need help quitting, ask your health care provider. Do not use drugs. If you are sexually active, practice safe sex. Use a condom or other form of protection to prevent STIs. If you do not wish to become pregnant, use a form of birth control. If you plan to become pregnant, see your health care provider for a prepregnancy visit. Take aspirin only as told by your health care provider. Make sure that you understand how much to take and what  form to take. Work with your health care provider to find out whether it is safe and beneficial for you to take aspirin daily. Find healthy ways to manage stress, such as: Meditation, yoga, or listening to music. Journaling. Talking to a trusted person. Spending time with friends and family. Minimize exposure to UV radiation to reduce your risk of skin cancer. Safety Always wear your seat belt while driving or riding in a vehicle. Do not drive: If you have been drinking alcohol. Do not ride with someone who has been drinking. When you are tired or distracted. While texting. If you have been using any mind-altering substances or drugs. Wear a helmet and other protective equipment during sports activities. If you have firearms in your house, make sure you follow all gun safety procedures. Seek help if you have been physically or sexually abused. What's next? Visit your health care provider once a year for an annual wellness visit. Ask your health care provider how often you should have your eyes and teeth checked. Stay up to date on all vaccines. This information is not intended to replace advice given to you by your health care provider. Make sure you discuss any questions you have with your health care provider. Document Revised: 10/27/2020 Document Reviewed: 10/27/2020 Elsevier Patient Education  [place].

## 2021-05-17 NOTE — Progress Notes (Signed)
HPI: Charlene Bell is a 56 y.o. female who  has a past medical history of Anxiety, Depression, Dupuytren's contracture of right hand, Hypertension, Iron deficiency anemia (04/15/2018), Left breast mass, Microcytic anemia (02/01/2018), and Perimenopause.  she presents to [facility] today, 05/18/21,  for chief complaint of: Annual physical exam  Dentist: UTD, q 6 months Eye exam: UTD, new glasses s/p Lasik 10 years ago Exercise: strength and cardio for about 45 minutes daily Diet: no restrictions Pap smear: due 01/2023 Mammogram: schedule in February Colon cancer screening: done in 2020 COVID vaccine: done, 2 boosters  Concerns: HTN- taking Verapamil SR $RemoveBefo'180mg'APcXYAHkIeA$  daily as prescribed, tolerating well. Not checking BP regularly at home. Has a cuff that measures on the arm. Eating salt in moderation.   Mood- taking Effexor $RemoveBefore'75mg'FQKOcYrfsNYjq$  daily, tolerating well without side effects. Notes that Oct-Dec are bad months for her. Feels like the medicine is working well but there is added stress at this time of the year. Not interested in dose changes until she is able to see how she feels when Spring comes. Using BuSpar $RemoveBefo'15mg'StWSZKaklCR$  BID with good management of anxiety. Denies SI/HI.   Past medical, surgical, social and family history reviewed:  Patient Active Problem List   Diagnosis Date Noted   Acute low back pain 09/07/2020   Anxiety disorder 10/14/2018   Family history of cerebral aneurysm 10/02/2018   History of iron deficiency 10/02/2018   Low ferritin 10/02/2018   Hypertension goal BP (blood pressure) < 130/80 04/15/2018   Migraine without aura and without status migrainosus, not intractable 04/15/2018   Leukopenia 02/01/2018   Mixed hyperlipidemia 02/01/2018   Dysthymia 01/10/2018   Family history of colon cancer in father 01/10/2018   Dupuytren's contracture of right hand     Past Surgical History:  Procedure Laterality Date   BREAST BIOPSY Left 2008   all findings  benign    COLONOSCOPY  2015   normal per patient   HAND TENDON SURGERY Right [place]  02/2015   REFRACTIVE SURGERY     TUBAL LIGATION     UPPER GASTROINTESTINAL ENDOSCOPY      Social History   Tobacco Use   Smoking status: Never   Smokeless tobacco: Never  Substance Use Topics   Alcohol use: Not Currently    Alcohol/week: 14.0 - 21.0 standard drinks    Types: 14 - 21 Standard drinks or equivalent per week    Comment: last drink 02/03/18    Family History  Problem Relation Age of Onset   Dementia Sister    Aneurysm Sister    CVA Sister    Heart attack Mother    Colon cancer Father    Heart failure Father    Esophageal cancer Neg Hx    Rectal cancer Neg Hx    Stomach cancer Neg Hx      Current medication list and allergy/intolerance information reviewed:    Current Outpatient Medications  Medication Sig Dispense Refill   atorvastatin (LIPITOR) 10 MG tablet Take 1 tablet (10 mg total) by mouth daily. 90 tablet 3   Calcium Carbonate-Vitamin D3 600-400 MG-UNIT TABS Take 1 tablet by mouth 2 (two) times daily.     docusate sodium (COLACE) 100 MG capsule Take 100 mg by mouth 2 (two) times daily as needed for constipation.     venlafaxine (EFFEXOR) 75 MG tablet TAKE 1 TABLET (75 MG TOTAL) BY MOUTH 3 (THREE) TIMES DAILY. 90 tablet 2   busPIRone (  BUSPAR) 15 MG tablet Take 0.5-1 tablets (7.5-15 mg total) by mouth 2 (two) times daily. 180 tablet 1   verapamil (CALAN-SR) 180 MG CR tablet Take 1 tablet (180 mg total) by mouth at bedtime. 90 tablet 1   No current facility-administered medications for this visit.    No Known Allergies    Review of Systems: Constitutional:  No  fever, no chills, No recent illness, No unintentional weight changes. No significant fatigue.  HEENT: No  headache, no vision change, no hearing change, No sore throat, No  sinus pressure Cardiac: No  chest pain, No  pressure, No palpitations, No  Orthopnea Respiratory:  No  shortness of  breath. No  Cough Gastrointestinal: No  abdominal pain, No  nausea, No  vomiting,  No  blood in stool, No  diarrhea, No  constipation  Musculoskeletal: No new myalgia/arthralgia Skin: No  Rash, No other wounds/concerning lesions Genitourinary: No  incontinence, No  abnormal genital bleeding, No abnormal genital discharge Hem/Onc: No  easy bruising/bleeding, No  abnormal lymph node Endocrine: No cold intolerance,  No heat intolerance. No polyuria/polydipsia/polyphagia  Neurologic: No  weakness, No  dizziness, No  slurred speech/focal weakness/facial droop Psychiatric: No  concerns with depression, No  concerns with anxiety, No sleep problems, No mood problems  Exam:  BP (!) 147/81    Pulse 73    Resp 20    Ht 5\' 4"  (1.626 m)    Wt 155 lb (70.3 kg)    SpO2 95%    BMI 26.61 kg/m  Constitutional: VS see above. General Appearance: alert, well-developed, well-nourished, NAD Eyes: Normal lids and conjunctive, non-icteric sclera Ears, Nose, Mouth, Throat: MMM, Normal external inspection ears/nares/mouth/lips/gums. TM normal bilaterally.   Neck: No masses, trachea midline. No thyroid enlargement. No tenderness/mass appreciated. No lymphadenopathy Respiratory: Normal respiratory effort. no wheeze, no rhonchi, no rales Cardiovascular: S1/S2 normal, no murmur, no rub/gallop auscultated. RRR. No lower extremity edema. Pedal pulse II/IV bilaterally PT. No carotid bruit or JVD. No abdominal aortic bruit. Gastrointestinal: Nontender, no masses. No hepatomegaly, no splenomegaly. No hernia appreciated. Bowel sounds normal. Rectal exam deferred.  Musculoskeletal: Gait normal. No clubbing/cyanosis of digits.  Neurological: Normal balance/coordination. No tremor. No cranial nerve deficit on limited exam. Motor and sensation intact and symmetric. Cerebellar reflexes intact.  Skin: warm, dry, intact. No rash/ulcer. No concerning nevi or subq nodules on limited exam.   Psychiatric: Normal judgment/insight. Normal  mood and affect. Oriented x3.    ASSESSMENT/PLAN:   1. Annual physical exam Reviewed labs drawn last week. UTD on preventative care. Wellness information given via AVS.   2. Encounter to establish care Reviewed available information and discussed care concerns with patient.   3. Hypertension goal BP (blood pressure) < 130/80 Continue Verapamil 180mg  daily as prescribed. Continue exercise. Work on limiting dietary sodium. Check BP at home with goal of 130/80 or less. Return in 2 weeks for a NV for BP check and make sure to bring the home cuff for verification of accuracy.  - verapamil (CALAN-SR) 180 MG CR tablet; Take 1 tablet (180 mg total) by mouth at bedtime.  Dispense: 90 tablet; Refill: 1  4. Migraine without aura and without status migrainosus, not intractable Continue Verapamil. Ok to use Ibuprofen/Tylenol prn.   5. Anxiety disorder, unspecified type 6. Dysthymia Continue Effexor and BuSpar as prescribed.   7. Low ferritin Checking iron panel.  - Fe+TIBC+Fer  8. Mixed hyperlipidemia Reviewed recent lipid results. LDL at 186 which is borderline. ASCVD risk is  low but with her family history, recommend starting a low dose statin. Patient agreeable so sending in Lipitor $RemoveBe'10mg'vGHqDDeUd$  daily. Statin intolerance information provided with AVS.  - Lipid panel - COMPLETE METABOLIC PANEL WITH GFR  9. History of iron deficiency Checking iron panel.  - Fe+TIBC+Fer  Orders Placed This Encounter  Procedures   Fe+TIBC+Fer   Lipid panel   COMPLETE METABOLIC PANEL WITH GFR    Meds ordered this encounter  Medications   busPIRone (BUSPAR) 15 MG tablet    Sig: Take 0.5-1 tablets (7.5-15 mg total) by mouth 2 (two) times daily.    Dispense:  180 tablet    Refill:  1    Order Specific Question:   Supervising Provider    Answer:   MATTHEWS, CODY [[id number]]   verapamil (CALAN-SR) 180 MG CR tablet    Sig: Take 1 tablet (180 mg total) by mouth at bedtime.    Dispense:  90 tablet    Refill:  1     Order Specific Question:   Supervising Provider    Answer:   MATTHEWS, CODY [[id number]]   atorvastatin (LIPITOR) 10 MG tablet    Sig: Take 1 tablet (10 mg total) by mouth daily.    Dispense:  90 tablet    Refill:  3    Order Specific Question:   Supervising Provider    Answer:   Luetta Nutting [[id number]]    Patient Instructions  Statin Intolerance Statin intolerance is the inability to take a certain type of cholesterol-lowering medicine (statin) because of unwanted side effects, such as muscle pain. Statins may be prescribed to decrease cholesterol levels and lower the risk for heart attack, heart disease, and stroke. People with statin intolerance may experience muscle pain and cramps (myalgia) that go away when the statin is stopped. What are the causes? The cause of this condition is not known. What increases the risk? You may be at higher risk for statin intolerance if you: Take more than one type of cholesterol-lowering medicine at a time. Need a higher-than-normal dosage of a statin. Have any of these medical problems: A history of surgery or trauma. Kidney, liver, joint, or muscle disease. A low level of hormones that control how your body uses energy (hypothyroidism). A lack of vitamin D (deficiency). Have a family history of muscle aches with or without statin therapy. Drink a lot of alcohol. Have any of these characteristics: Being older than 56 years of age. Having a body size that is smaller than normal. Being female. Being of Asian descent. Take certain medicines, including: Certain medicines for mental illness (antipsychotics). Some types of antibiotics or antifungals. Certain medicines used for blood pressure or heart disease. Medicines that reduce the activity of the body's disease-fighting system (immunosuppressants). Medicines to treat hepatitis C and HIV or AIDS (human immunodeficiency virus or acquired immunodeficiency syndrome). Follow an intense exercise  program. Drink a lot of grapefruit juice. What are the signs or symptoms? Symptoms of statin intolerance include: General muscle aches, or myalgia. This may feel similar to muscle aches that are caused by the flu. Muscle pain, tenderness, cramps, or weakness (myositis). Severe muscle pain, weakness, and raised blood CK levels (rhabdomyolysis). Symptoms usually go away when the statin is stopped. Rarely, liver damage can also occur, which can cause: Dark yellow or brown urine. Light-colored stool (feces). Loss of appetite. Pain in the upper right abdomen. Unusual weakness or fatigue. Yellowing of the skin or the white parts of the eyes (jaundice). How is this diagnosed?  This condition is diagnosed based on your symptoms, your medical history, and a physical exam. You may also have blood tests. How is this treated? Your health care provider may have you stop taking the statin for a short time to see if your symptoms go away. Then your provider may restart your statin or: Change you to a different statin. Lower the dosage of your statin. Have you take your statin less often. Change you to another type of cholesterol-lowering medicine. Stop or change any medicines that might be interfering with your statin. Limit how much grapefruit juice you drink. Recommend stopping intense exercise. In most cases, statin intolerance can be managed, and you can continue to take a cholesterol-lowering medicine. Follow these instructions at home: Medicines Take your statin medicine as told by your health care provider. Do not stop taking the statin unless your health care provider tells you to stop. Take other over-the-counter and prescription medicines only as told by your health care provider. Check with your health care provider before taking any new medicines. Certain medicines can increase your risk for statin intolerance. Lifestyle If you drink alcohol: Limit how much you have to: 0-1 drink a day  for women who are not pregnant. 0-2 drinks a day for men. Know how much alcohol is in a drink. In the [place] one drink equals one 12 oz bottle of beer (355 mL), one 5 oz glass of wine (148 mL), or one 1 oz glass of hard liquor (44 mL). Do not use any products that contain nicotine or tobacco. These products include cigarettes, chewing tobacco, and vaping devices, such as e-cigarettes. If you need help quitting, ask your health care provider. General instructions  Have blood tests to check CK levels or liver enzymes as told by your health care provider. Exercise as directed. Ask your health care provider what exercises are best for you. Do not start a new exercise program before talking about it with your health care provider. Follow instructions from your health care provider about eating or drinking restrictions. Your health care provider may recommend: Limiting the amount of grapefruit juice you drink, or not drinking it at all. Eating a diet that is low in saturated fats and high in fiber. Maintain a healthy weight with diet and exercise. Keep all follow-up visits. This is important. Contact a health care provider if: You have any symptoms of statin intolerance. Summary Statins are important medicines for decreasing your cholesterol, which may reduce your risk for heart attack and stroke. Some people are not able to continue taking a particular statin because of muscle problems (myalgia) or other side effects. Myalgia is the most common symptom of statin intolerance. Often, the muscle pain and cramps from myalgia go away when the statin is stopped. Although rare, liver damage can occur as a result of statin intolerance. You should have routine blood tests to check your liver enzymes. In most cases, statin intolerance can be managed, and you can continue to take a cholesterol-lowering medicine. This information is not intended to replace advice given to you by your health care provider. Make  sure you discuss any questions you have with your health care provider. Document Revised: 07/01/2020 Document Reviewed: 07/01/2020 Elsevier Patient Education  2022 Elsevier Inc.    Preventive Care 72-39 Years Old, Female Preventive care refers to lifestyle choices and visits with your health care provider that can promote health and wellness. Preventive care visits are also called wellness exams. What can I expect for my preventive  care visit? Counseling Your health care provider may ask you questions about your: Medical history, including: Past medical problems. Family medical history. Pregnancy history. Current health, including: Menstrual cycle. Method of birth control. Emotional well-being. Home life and relationship well-being. Sexual activity and sexual health. Lifestyle, including: Alcohol, nicotine or tobacco, and drug use. Access to firearms. Diet, exercise, and sleep habits. Work and work Statistician. Sunscreen use. Safety issues such as seatbelt and bike helmet use. Physical exam Your health care provider will check your: Height and weight. These may be used to calculate your BMI (body mass index). BMI is a measurement that tells if you are at a healthy weight. Waist circumference. This measures the distance around your waistline. This measurement also tells if you are at a healthy weight and may help predict your risk of certain diseases, such as type 2 diabetes and high blood pressure. Heart rate and blood pressure. Body temperature. Skin for abnormal spots. What immunizations do I need? Vaccines are usually given at various ages, according to a schedule. Your health care provider will recommend vaccines for you based on your age, medical history, and lifestyle or other factors, such as travel or where you work. What tests do I need? Screening Your health care provider may recommend screening tests for certain conditions. This may include: Lipid and cholesterol  levels. Diabetes screening. This is done by checking your blood sugar (glucose) after you have not eaten for a while (fasting). Pelvic exam and Pap test. Hepatitis B test. Hepatitis C test. HIV (human immunodeficiency virus) test. STI (sexually transmitted infection) testing, if you are at risk. Lung cancer screening. Colorectal cancer screening. Mammogram. Talk with your health care provider about when you should start having regular mammograms. This may depend on whether you have a family history of breast cancer. BRCA-related cancer screening. This may be done if you have a family history of breast, ovarian, tubal, or peritoneal cancers. Bone density scan. This is done to screen for osteoporosis. Talk with your health care provider about your test results, treatment options, and if necessary, the need for more tests. Follow these instructions at home: Eating and drinking  Eat a diet that includes fresh fruits and vegetables, whole grains, lean protein, and low-fat dairy products. Take vitamin and mineral supplements as recommended by your health care provider. Do not drink alcohol if: Your health care provider tells you not to drink. You are pregnant, may be pregnant, or are planning to become pregnant. If you drink alcohol: Limit how much you have to 0-1 drink a day. Know how much alcohol is in your drink. In the [place] one drink equals one 12 oz bottle of beer (355 mL), one 5 oz glass of wine (148 mL), or one 1 oz glass of hard liquor (44 mL). Lifestyle Brush your teeth every morning and night with fluoride toothpaste. Floss one time each day. Exercise for at least 30 minutes 5 or more days each week. Do not use any products that contain nicotine or tobacco. These products include cigarettes, chewing tobacco, and vaping devices, such as e-cigarettes. If you need help quitting, ask your health care provider. Do not use drugs. If you are sexually active, practice safe sex. Use a  condom or other form of protection to prevent STIs. If you do not wish to become pregnant, use a form of birth control. If you plan to become pregnant, see your health care provider for a prepregnancy visit. Take aspirin only as told by your health care  provider. Make sure that you understand how much to take and what form to take. Work with your health care provider to find out whether it is safe and beneficial for you to take aspirin daily. Find healthy ways to manage stress, such as: Meditation, yoga, or listening to music. Journaling. Talking to a trusted person. Spending time with friends and family. Minimize exposure to UV radiation to reduce your risk of skin cancer. Safety Always wear your seat belt while driving or riding in a vehicle. Do not drive: If you have been drinking alcohol. Do not ride with someone who has been drinking. When you are tired or distracted. While texting. If you have been using any mind-altering substances or drugs. Wear a helmet and other protective equipment during sports activities. If you have firearms in your house, make sure you follow all gun safety procedures. Seek help if you have been physically or sexually abused. What's next? Visit your health care provider once a year for an annual wellness visit. Ask your health care provider how often you should have your eyes and teeth checked. Stay up to date on all vaccines. This information is not intended to replace advice given to you by your health care provider. Make sure you discuss any questions you have with your health care provider. Document Revised: 10/27/2020 Document Reviewed: 10/27/2020 Elsevier Patient Education  [place].   Follow-up plan: Return in about 2 weeks (around 06/01/2021) for nurse visit for BP check; labs in 6 weeks.  Clearnce Sorrel, DNP, APRN, FNP-BC [facility]

## 2021-05-18 ENCOUNTER — Other Ambulatory Visit: Payer: Self-pay

## 2021-05-18 ENCOUNTER — Encounter: Payer: Self-pay | Admitting: Medical-Surgical

## 2021-05-18 ENCOUNTER — Ambulatory Visit (INDEPENDENT_AMBULATORY_CARE_PROVIDER_SITE_OTHER): Payer: BC Managed Care – PPO | Admitting: Medical-Surgical

## 2021-05-18 VITALS — BP 147/81 | HR 73 | Resp 20 | Ht 64.0 in | Wt 155.0 lb

## 2021-05-18 DIAGNOSIS — F419 Anxiety disorder, unspecified: Secondary | ICD-10-CM

## 2021-05-18 DIAGNOSIS — F341 Dysthymic disorder: Secondary | ICD-10-CM

## 2021-05-18 DIAGNOSIS — Z8639 Personal history of other endocrine, nutritional and metabolic disease: Secondary | ICD-10-CM

## 2021-05-18 DIAGNOSIS — E782 Mixed hyperlipidemia: Secondary | ICD-10-CM

## 2021-05-18 DIAGNOSIS — R790 Abnormal level of blood mineral: Secondary | ICD-10-CM

## 2021-05-18 DIAGNOSIS — Z7689 Persons encountering health services in other specified circumstances: Secondary | ICD-10-CM

## 2021-05-18 DIAGNOSIS — G43009 Migraine without aura, not intractable, without status migrainosus: Secondary | ICD-10-CM

## 2021-05-18 DIAGNOSIS — I10 Essential (primary) hypertension: Secondary | ICD-10-CM | POA: Diagnosis not present

## 2021-05-18 DIAGNOSIS — Z0000 Encounter for general adult medical examination without abnormal findings: Secondary | ICD-10-CM

## 2021-05-18 DIAGNOSIS — Z131 Encounter for screening for diabetes mellitus: Secondary | ICD-10-CM

## 2021-05-18 DIAGNOSIS — Z1329 Encounter for screening for other suspected endocrine disorder: Secondary | ICD-10-CM

## 2021-05-18 MED ORDER — BUSPIRONE HCL 15 MG PO TABS: 7.5000 mg | ORAL_TABLET | Freq: Two times a day (BID) | ORAL | 1 refills | Status: DC

## 2021-05-18 MED ORDER — VERAPAMIL HCL ER 180 MG PO TBCR: 180.0000 mg | EXTENDED_RELEASE_TABLET | Freq: Every day | ORAL | 1 refills | Status: DC

## 2021-05-18 MED ORDER — ATORVASTATIN CALCIUM 10 MG PO TABS: 10.0000 mg | ORAL_TABLET | Freq: Every day | ORAL | 3 refills | Status: DC

## 2021-06-01 ENCOUNTER — Ambulatory Visit: Payer: BC Managed Care – PPO

## 2021-06-07 ENCOUNTER — Ambulatory Visit (INDEPENDENT_AMBULATORY_CARE_PROVIDER_SITE_OTHER): Payer: BC Managed Care – PPO | Admitting: Medical-Surgical

## 2021-06-07 ENCOUNTER — Other Ambulatory Visit: Payer: Self-pay

## 2021-06-07 VITALS — BP 138/78 | HR 91

## 2021-06-07 DIAGNOSIS — I10 Essential (primary) hypertension: Secondary | ICD-10-CM

## 2021-06-07 NOTE — Progress Notes (Signed)
Established Patient Office Visit  Subjective:  Patient ID: Charlene Bell, female    DOB: [birth year]  Age: 56 y.o. MRN: [id number]  CC:  Chief Complaint  Patient presents with   Hypertension    HPI Charlene Bell presents for blood pressure check. Home monitor checked and blood pressure was 134/87 which is close enough to our reading in the office. Denies chest pain, shortness of breath or dizziness.   Home readings -  120/79 131/82 143/93 129/84 128/86 131/91 132/87 131/83 140/86 132/87 128/82 133/84 135/89  Past Medical History:  Diagnosis Date   Anxiety    Depression    Dupuytren's contracture of right hand    Hypertension    Iron deficiency anemia 04/15/2018   Left breast mass    Microcytic anemia 02/01/2018   Perimenopause     Past Surgical History:  Procedure Laterality Date   BREAST BIOPSY Left 2008   all findings benign    COLONOSCOPY  2015   normal per patient   HAND TENDON SURGERY Right [place]  02/2015   REFRACTIVE SURGERY     TUBAL LIGATION     UPPER GASTROINTESTINAL ENDOSCOPY      Family History  Problem Relation Age of Onset   Dementia Sister    Aneurysm Sister    CVA Sister    Heart attack Mother    Colon cancer Father    Heart failure Father    Esophageal cancer Neg Hx    Rectal cancer Neg Hx    Stomach cancer Neg Hx     Social History   Socioeconomic History   Marital status: Married    Spouse name: Not on file   Number of children: Not on file   Years of education: Not on file   Highest education level: Not on file  Occupational History   Not on file  Tobacco Use   Smoking status: Never   Smokeless tobacco: Never  Vaping Use   Vaping Use: Never used  Substance and Sexual Activity   Alcohol use: Not Currently    Alcohol/week: 14.0 - 21.0 standard drinks    Types: 14 - 21 Standard drinks or equivalent per week    Comment: last drink 02/03/18   Drug use: Never   Sexual activity: Yes    Partners: Female     Birth control/protection: None  Other Topics Concern   Not on file  Social History Narrative   Not on file   Social Determinants of Health   Financial Resource Strain: Not on file  Food Insecurity: Not on file  Transportation Needs: Not on file  Physical Activity: Not on file  Stress: Not on file  Social Connections: Not on file  Intimate Partner Violence: Not on file    Outpatient Medications Prior to Visit  Medication Sig Dispense Refill   atorvastatin (LIPITOR) 10 MG tablet Take 1 tablet (10 mg total) by mouth daily. 90 tablet 3   busPIRone (BUSPAR) 15 MG tablet Take 0.5-1 tablets (7.5-15 mg total) by mouth 2 (two) times daily. 180 tablet 1   Calcium Carbonate-Vitamin D3 600-400 MG-UNIT TABS Take 1 tablet by mouth 2 (two) times daily.     docusate sodium (COLACE) 100 MG capsule Take 100 mg by mouth 2 (two) times daily as needed for constipation.     venlafaxine (EFFEXOR) 75 MG tablet TAKE 1 TABLET (75 MG TOTAL) BY MOUTH 3 (THREE) TIMES DAILY. 90 tablet 2   verapamil (CALAN-SR) 180 MG CR  tablet Take 1 tablet (180 mg total) by mouth at bedtime. 90 tablet 1   No facility-administered medications prior to visit.    No Known Allergies  ROS Review of Systems    Objective:    Physical Exam  BP 140/83    Pulse 91    SpO2 99%  Wt Readings from Last 3 Encounters:  05/18/21 155 lb (70.3 kg)  03/22/21 157 lb 0.6 oz (71.2 kg)  06/22/20 143 lb 0.6 oz (64.9 kg)     Health Maintenance Due  Topic Date Due   HIV Screening  Never done   Hepatitis C Screening  Never done    There are no preventive care reminders to display for this patient.  Lab Results  Component Value Date   TSH 0.98 05/11/2021   Lab Results  Component Value Date   WBC 4.2 05/11/2021   HGB 13.9 05/11/2021   HCT 41.4 05/11/2021   MCV 91.6 05/11/2021   PLT 259 05/11/2021   Lab Results  Component Value Date   NA 142 05/11/2021   K 4.8 05/11/2021   CO2 28 05/11/2021   GLUCOSE 89 05/11/2021   BUN  14 05/11/2021   CREATININE 0.80 05/11/2021   BILITOT 0.4 05/11/2021   AST 12 05/11/2021   ALT 13 05/11/2021   PROT 6.6 05/11/2021   CALCIUM 9.2 05/11/2021   EGFR 87 05/11/2021   Lab Results  Component Value Date   CHOL 291 (H) 05/11/2021   Lab Results  Component Value Date   HDL 87 05/11/2021   Lab Results  Component Value Date   LDLCALC 186 (H) 05/11/2021   Lab Results  Component Value Date   TRIG 75 05/11/2021   Lab Results  Component Value Date   CHOLHDL 3.3 05/11/2021   No results found for: HGBA1C    Assessment & Plan:  Hypertension - Blood pressure under 140/90. Patient advised to continue medications as directed. Follow up in 3 months with Joy for hypertension.   Problem List Items Addressed This Visit   None Visit Diagnoses     Benign hypertension    -  Primary       No orders of the defined types were placed in this encounter.   Follow-up: Return in about 3 months (around 09/05/2021) for hypertension with Joy. Durene Romans, Monico Blitz, [place]

## 2021-06-22 ENCOUNTER — Other Ambulatory Visit: Payer: Self-pay

## 2021-06-22 ENCOUNTER — Ambulatory Visit (INDEPENDENT_AMBULATORY_CARE_PROVIDER_SITE_OTHER): Payer: BC Managed Care – PPO

## 2021-06-22 DIAGNOSIS — Z1231 Encounter for screening mammogram for malignant neoplasm of breast: Secondary | ICD-10-CM | POA: Diagnosis not present

## 2021-06-23 ENCOUNTER — Other Ambulatory Visit: Payer: Self-pay | Admitting: Medical-Surgical

## 2021-06-23 DIAGNOSIS — R928 Other abnormal and inconclusive findings on diagnostic imaging of breast: Secondary | ICD-10-CM

## 2021-07-08 ENCOUNTER — Ambulatory Visit
Admission: RE | Admit: 2021-07-08 | Discharge: 2021-07-08 | Disposition: A | Discharge status: Home / Self Care | Payer: BC Managed Care – PPO | Source: Ambulatory Visit | Attending: Medical-Surgical | Admitting: Medical-Surgical

## 2021-07-08 DIAGNOSIS — R928 Other abnormal and inconclusive findings on diagnostic imaging of breast: Secondary | ICD-10-CM

## 2021-07-08 DIAGNOSIS — N6001 Solitary cyst of right breast: Secondary | ICD-10-CM | POA: Diagnosis not present

## 2021-07-08 DIAGNOSIS — R922 Inconclusive mammogram: Secondary | ICD-10-CM | POA: Diagnosis not present

## 2021-08-03 ENCOUNTER — Other Ambulatory Visit: Payer: Self-pay | Admitting: Family Medicine

## 2021-08-11 ENCOUNTER — Other Ambulatory Visit: Payer: Self-pay | Admitting: Medical-Surgical

## 2021-08-11 MED ORDER — VENLAFAXINE HCL 75 MG PO TABS: 75.0000 mg | ORAL_TABLET | Freq: Three times a day (TID) | ORAL | 0 refills | Status: DC

## 2021-08-11 NOTE — Telephone Encounter (Signed)
Left detailed message advising of medication refill and the need to keep up coming appointment.  ?

## 2021-09-07 DIAGNOSIS — Z8639 Personal history of other endocrine, nutritional and metabolic disease: Secondary | ICD-10-CM | POA: Diagnosis not present

## 2021-09-07 DIAGNOSIS — E782 Mixed hyperlipidemia: Secondary | ICD-10-CM | POA: Diagnosis not present

## 2021-09-07 DIAGNOSIS — R790 Abnormal level of blood mineral: Secondary | ICD-10-CM | POA: Diagnosis not present

## 2021-09-08 ENCOUNTER — Ambulatory Visit (INDEPENDENT_AMBULATORY_CARE_PROVIDER_SITE_OTHER): Payer: BC Managed Care – PPO | Admitting: Medical-Surgical

## 2021-09-08 ENCOUNTER — Encounter: Payer: Self-pay | Admitting: Medical-Surgical

## 2021-09-08 VITALS — BP 118/79 | HR 78 | Resp 20 | Ht 64.0 in | Wt 140.1 lb

## 2021-09-08 DIAGNOSIS — F419 Anxiety disorder, unspecified: Secondary | ICD-10-CM | POA: Diagnosis not present

## 2021-09-08 DIAGNOSIS — F341 Dysthymic disorder: Secondary | ICD-10-CM

## 2021-09-08 DIAGNOSIS — R790 Abnormal level of blood mineral: Secondary | ICD-10-CM | POA: Diagnosis not present

## 2021-09-08 DIAGNOSIS — I10 Essential (primary) hypertension: Secondary | ICD-10-CM | POA: Diagnosis not present

## 2021-09-08 LAB — COMPLETE METABOLIC PANEL WITH GFR
AG Ratio: 2 (calc) (ref 1.0–2.5)
ALT: 15 U/L (ref 6–29)
AST: 10 U/L (ref 10–35)
Albumin: 4.3 g/dL (ref 3.6–5.1)
Alkaline phosphatase (APISO): 65 U/L (ref 37–153)
BUN: 12 mg/dL (ref 7–25)
CO2: 27 mmol/L (ref 20–32)
Calcium: 9.6 mg/dL (ref 8.6–10.4)
Chloride: 104 mmol/L (ref 98–110)
Creat: 0.71 mg/dL (ref 0.50–1.03)
Globulin: 2.2 g/dL (calc) (ref 1.9–3.7)
Glucose, Bld: 95 mg/dL (ref 65–99)
Potassium: 4.3 mmol/L (ref 3.5–5.3)
Sodium: 142 mmol/L (ref 135–146)
Total Bilirubin: 0.5 mg/dL (ref 0.2–1.2)
Total Protein: 6.5 g/dL (ref 6.1–8.1)
eGFR: 100 mL/min/{1.73_m2} (ref >=60)

## 2021-09-08 LAB — IRON,TIBC AND FERRITIN PANEL
%SAT: 23 % (calc) (ref 16–45)
Ferritin: 38 ng/mL (ref 16–232)
Iron: 57 ug/dL (ref 45–160)
TIBC: 247 mcg/dL (calc) — ABNORMAL LOW (ref 250–450)

## 2021-09-08 LAB — LIPID PANEL
Cholesterol: 181 mg/dL (ref <200)
HDL: 61 mg/dL (ref >=50)
LDL Cholesterol (Calc): 102 mg/dL (calc) — ABNORMAL HIGH
Non-HDL Cholesterol (Calc): 120 mg/dL (calc) (ref <130)
Total CHOL/HDL Ratio: 3 (calc) (ref <5.0)
Triglycerides: 90 mg/dL (ref <150)

## 2021-09-08 MED ORDER — VENLAFAXINE HCL ER 37.5 MG PO CP24: 37.5000 mg | ORAL_CAPSULE | Freq: Every day | ORAL | 1 refills | Status: DC

## 2021-09-08 NOTE — Progress Notes (Signed)
?  HPI with pertinent ROS:  ? ?CC: HTN follow up ? ?HPI: ?Very pleasant 56 year old female presenting today for hypertension follow-up.  She has been taking verapamil 180 mg daily, tolerating well without side effects.  Checking her blood pressures at home with readings at goal.  Today her blood pressure is 118/79 and she is doing well overall. Denies CP, SOB, palpitations, lower extremity edema, dizziness, headaches, or vision changes.  She is currently doing a program for weight loss involving intermittent fasting, reduction of sugars and fats, and limited exercise.  She has lost approximately 15 pounds since January and is very happy with her results. ? ?Mood-taking Effexor 75 mg 3 times daily and has been on this regimen for quite a while.  Over the last month, she has begun to feel somewhat fatigued and unmotivated.  Wonders if this is related to her weight loss, her dietary intake, or may be the medication.  Would like to play around with her dosing to see if the medication is the problem. Also wonders if cutting out exercise is playing a part.   ? ?I reviewed the past medical history, family history, social history, surgical history, and allergies today and no changes were needed.  Please see the problem list section below in epic for further details. ? ? ?Physical exam:  ? ?General: Well Developed, well nourished, and in no acute distress.  ?Neuro: Alert and oriented x3.  ?HEENT: Normocephalic, atraumatic.  ?Skin: Warm and dry. ?Cardiac: Regular rate and rhythm, no murmurs rubs or gallops, no lower extremity edema.  ?Respiratory: Clear to auscultation bilaterally. Not using accessory muscles, speaking in full sentences. ? ?Impression and Recommendations:   ? ?1. Hypertension goal BP (blood pressure) < 130/80 ?Blood pressure looks fabulous today.  Continue weight loss efforts.  Add in less strenuous exercise such as yoga as tolerated.  Continue dietary modifications.  Continue verapamil 180 mg daily as  prescribed.  Continue monitoring blood pressure at home with goal of 130/80 or less.  If blood pressure starts to drop too low as weight loss continues, please return for further evaluation. ? ?2. Low ferritin ?Doing well with multivitamin with iron.  Ferritin results normal. ? ?3. Anxiety disorder, unspecified type ?4. Dysthymia ?Continue Effexor but may need to start reducing dosages.  Sending in Effexor 37.5 mg daily to allow for adjusting doses down.  Recommend starting with the afternoon dose of 37.5 mg and 75 mg in the morning and at bedtime.  If tolerating this, may consider dropping another dose during the day to 37.5 mg instead of 75.  We will plan to follow-up in about 4 weeks (virtual is fine) to see how she is doing and if she is found a good spot for her dosing.  Recommend adding in some sort of physical activity that is low impact like yoga since this may have some good benefit for her mental health. ? ?Return in about 4 weeks (around 10/06/2021) for mood follow up. ?___________________________________________ ?Thayer Ohm, DNP, APRN, FNP-BC ?Primary Care and Sports Medicine ?[facility] ?

## 2021-10-10 NOTE — Progress Notes (Unsigned)
Virtual Visit via Video Note  I connected with Charlene Bell on 10/11/21 at  1:00 PM EDT by a video enabled telemedicine application and verified that I am speaking with the correct person using two identifiers.   I discussed the limitations of evaluation and management by telemedicine and the availability of in person appointments. The patient expressed understanding and agreed to proceed.  Patient location: home Provider locations: office  Subjective:    CC: mood follow up  HPI: Pleasant 56 year old female presenting via MyChart video visit for follow up on mood.  Since her last visit, she has been doing very well overall.  She has been adjusting her Effexor dosing depending on her symptoms.  Currently she is taking 75 mg in the morning and at bedtime.  She is taking 37.5 mg in the afternoon.  This seems to be a very good dose for her and even though she is still planning to play around with it, she would like to stay here for now.  She also was taking BuSpar 15 mg twice daily and notes that she would like to play around with the dosing on this to see if she can find the lowest effective dose.  Feels the current regimen keeps her mood stable and she is not have any issues with night sweats, hot flashes, mood swings, anxiety attacks, or difficulty sleeping.  Past medical history, Surgical history, Family history not pertinant except as noted below, Social history, Allergies, and medications have been entered into the medical record, reviewed, and corrections made.   Review of Systems: See HPI for pertinent positives and negatives.   Objective:    General: Speaking clearly in complete sentences without any shortness of breath.  Alert and oriented x3.  Normal judgment. No apparent acute distress.  Impression and Recommendations:    Anxiety disorder Dysthymia So far doing very well on adjusting her medications depending on her mood.  Okay to continue adjusting doses to find the lowest  effective regimen for her.  She will let me know what seems to be working best for her when she needs refills.  Okay to adjust BuSpar to half tab twice daily and if she feels that she would like to go down further, we can send in a supply of smaller milligram tablets.  Patient verbalized understanding and agrees to the plan.  I discussed the assessment and treatment plan with the patient. The patient was provided an opportunity to ask questions and all were answered. The patient agreed with the plan and demonstrated an understanding of the instructions.   The patient was advised to call back or seek an in-person evaluation if the symptoms worsen or if the condition fails to improve as anticipated.  20 minutes of non-face-to-face time was provided during this encounter.  Return in about 6 months (around 04/13/2022) for mood follow up.  Clearnce Sorrel, DNP, APRN, FNP-BC [facility]

## 2021-10-11 ENCOUNTER — Encounter: Payer: Self-pay | Admitting: Medical-Surgical

## 2021-10-11 ENCOUNTER — Telehealth (INDEPENDENT_AMBULATORY_CARE_PROVIDER_SITE_OTHER): Payer: BC Managed Care – PPO | Admitting: Medical-Surgical

## 2021-10-11 DIAGNOSIS — F419 Anxiety disorder, unspecified: Secondary | ICD-10-CM | POA: Diagnosis not present

## 2021-10-11 DIAGNOSIS — F341 Dysthymic disorder: Secondary | ICD-10-CM

## 2021-10-11 NOTE — Assessment & Plan Note (Signed)
So far doing very well on adjusting her medications depending on her mood.  Okay to continue adjusting doses to find the lowest effective regimen for her.  She will let me know what seems to be working best for her when she needs refills.  Okay to adjust BuSpar to half tab twice daily and if she feels that she would like to go down further, we can send in a supply of smaller milligram tablets.  Patient verbalized understanding and agrees to the plan.

## 2021-11-07 ENCOUNTER — Other Ambulatory Visit: Payer: Self-pay | Admitting: Medical-Surgical

## 2022-01-05 ENCOUNTER — Other Ambulatory Visit: Payer: Self-pay | Admitting: Medical-Surgical

## 2022-01-05 DIAGNOSIS — I10 Essential (primary) hypertension: Secondary | ICD-10-CM

## 2022-03-28 ENCOUNTER — Ambulatory Visit (INDEPENDENT_AMBULATORY_CARE_PROVIDER_SITE_OTHER): Payer: BC Managed Care – PPO

## 2022-03-28 ENCOUNTER — Ambulatory Visit (INDEPENDENT_AMBULATORY_CARE_PROVIDER_SITE_OTHER): Payer: BC Managed Care – PPO | Admitting: Sports Medicine

## 2022-03-28 DIAGNOSIS — M25511 Pain in right shoulder: Secondary | ICD-10-CM | POA: Diagnosis not present

## 2022-03-28 DIAGNOSIS — M7541 Impingement syndrome of right shoulder: Secondary | ICD-10-CM

## 2022-03-28 MED ORDER — MELOXICAM 15 MG PO TABS: ORAL_TABLET | ORAL | 3 refills | Status: AC

## 2022-03-28 NOTE — Assessment & Plan Note (Signed)
This is a very pleasant 56 year old female, she had several months of pain right shoulder localized anteriorly, worse with abduction. On exam she has positive impingement signs as well as pain referable to the subscap. Discussed the anatomy, pathophysiology, adding x-rays, meloxicam, home physical therapy, return to see me in 6 weeks, subacromial injection if no better.

## 2022-03-28 NOTE — Progress Notes (Signed)
    Procedures performed today:    None.  Independent interpretation of notes and tests performed by another provider:   None.  Brief History, Exam, Impression, and Recommendations:    Impingement syndrome, shoulder, right This is a very pleasant 56 year old female, she had several months of pain right shoulder localized anteriorly, worse with abduction. On exam she has positive impingement signs as well as pain referable to the subscap. Discussed the anatomy, pathophysiology, adding x-rays, meloxicam, home physical therapy, return to see me in 6 weeks, subacromial injection if no better.  Chronic process not at goal with pharmacologic intervention  ____________________________________________ Ihor Austin. Benjamin Stain, M.D., ABFM., CAQSM., AME. Primary Care and Sports Medicine [facility]  Adjunct Professor of Family Medicine  [employer] [facility] [employer]  Restaurant manager, fast food

## 2022-04-04 ENCOUNTER — Ambulatory Visit (INDEPENDENT_AMBULATORY_CARE_PROVIDER_SITE_OTHER): Payer: BC Managed Care – PPO | Admitting: Medical-Surgical

## 2022-04-04 DIAGNOSIS — Z23 Encounter for immunization: Secondary | ICD-10-CM

## 2022-04-11 ENCOUNTER — Ambulatory Visit: Payer: BC Managed Care – PPO

## 2022-05-11 ENCOUNTER — Ambulatory Visit (INDEPENDENT_AMBULATORY_CARE_PROVIDER_SITE_OTHER): Payer: BC Managed Care – PPO | Admitting: Sports Medicine

## 2022-05-11 DIAGNOSIS — M7541 Impingement syndrome of right shoulder: Secondary | ICD-10-CM

## 2022-05-11 NOTE — Assessment & Plan Note (Signed)
Doing significantly better with conservative treatment, she would like to continue conservative treatment for another 6 weeks or so before considering intervention. Of note we did get x-rays that were negative, and she does understand the anatomy and pathophysiology of the rotator cuff. Return to see me 6 weeks as needed.

## 2022-05-11 NOTE — Progress Notes (Signed)
    Procedures performed today:    None.  Independent interpretation of notes and tests performed by another provider:   None.  Brief History, Exam, Impression, and Recommendations:    Impingement syndrome, shoulder, right Doing significantly better with conservative treatment, she would like to continue conservative treatment for another 6 weeks or so before considering intervention. Of note we did get x-rays that were negative, and she does understand the anatomy and pathophysiology of the rotator cuff. Return to see me 6 weeks as needed.    ____________________________________________ Ihor Austin. Benjamin Stain, M.D., ABFM., CAQSM., AME. Primary Care and Sports Medicine [facility]  Adjunct Professor of Family Medicine  [employer] [facility] [employer]  Restaurant manager, fast food

## 2022-05-24 ENCOUNTER — Other Ambulatory Visit: Payer: Self-pay | Admitting: Medical-Surgical

## 2022-06-13 ENCOUNTER — Encounter: Payer: BC Managed Care – PPO | Admitting: Medical-Surgical

## 2022-06-16 ENCOUNTER — Encounter: Payer: Self-pay | Admitting: Medical-Surgical

## 2022-06-16 MED ORDER — VENLAFAXINE HCL 75 MG PO TABS: 75.0000 mg | ORAL_TABLET | Freq: Three times a day (TID) | ORAL | 0 refills | Status: DC

## 2022-06-27 ENCOUNTER — Encounter: Payer: Self-pay | Admitting: Medical-Surgical

## 2022-06-27 ENCOUNTER — Ambulatory Visit (INDEPENDENT_AMBULATORY_CARE_PROVIDER_SITE_OTHER): Payer: BC Managed Care – PPO | Admitting: Medical-Surgical

## 2022-06-27 VITALS — BP 127/71 | HR 70 | Resp 20 | Ht 64.0 in | Wt 139.1 lb

## 2022-06-27 DIAGNOSIS — R519 Headache, unspecified: Secondary | ICD-10-CM | POA: Diagnosis not present

## 2022-06-27 DIAGNOSIS — E782 Mixed hyperlipidemia: Secondary | ICD-10-CM

## 2022-06-27 DIAGNOSIS — Z0000 Encounter for general adult medical examination without abnormal findings: Secondary | ICD-10-CM | POA: Diagnosis not present

## 2022-06-27 DIAGNOSIS — I10 Essential (primary) hypertension: Secondary | ICD-10-CM

## 2022-06-27 DIAGNOSIS — Z13220 Encounter for screening for lipoid disorders: Secondary | ICD-10-CM

## 2022-06-27 DIAGNOSIS — F419 Anxiety disorder, unspecified: Secondary | ICD-10-CM

## 2022-06-27 DIAGNOSIS — F341 Dysthymic disorder: Secondary | ICD-10-CM

## 2022-06-27 MED ORDER — VENLAFAXINE HCL 75 MG PO TABS: 75.0000 mg | ORAL_TABLET | Freq: Three times a day (TID) | ORAL | 3 refills | Status: DC

## 2022-06-27 MED ORDER — VERAPAMIL HCL ER 180 MG PO TBCR: 180.0000 mg | EXTENDED_RELEASE_TABLET | Freq: Every day | ORAL | 3 refills | Status: DC

## 2022-06-27 MED ORDER — ATORVASTATIN CALCIUM 10 MG PO TABS: 10.0000 mg | ORAL_TABLET | Freq: Every day | ORAL | 3 refills | Status: DC

## 2022-06-27 NOTE — Progress Notes (Signed)
Complete physical exam  Patient: Charlene Bell   DOB: 03-10-1966   57 y.o. Female  MRN: [id number]  Subjective:    Chief Complaint  Patient presents with   Annual Exam   Charlene Bell is a 57 y.o. female who presents today for a complete physical exam. She reports consuming a general diet.  Does [employer] five times weekly, strength training twice a week.   She generally feels well. She reports sleeping well. She does not have additional problems to discuss today.   HTN: Taking verapamil 180 mg daily, tolerating well without side effects.  Checking blood pressures at home and notes that recently her top numbers have been in the 120s-130s but her bottom numbers seem to be running higher in the 80s-90s.  At one time, she had a systolic in the [id number] with a diastolic around [id number].  She does admit to eating salty foods and thinks that the deli ham that she has been eating a lot of lately may be what is causing the elevations in her blood pressure.  Reports that she is waking with a headache in the morning that is all over her head however when she is up and moving, the headache fully resolves without having to take any medication. Most recent fall risk assessment:    06/27/2022    3:43 PM  [place] in the past year? 0  Number falls in past yr: 0  Injury with Fall? 0  Risk for fall due to : No Fall Risks  Follow up Falls evaluation completed     Most recent depression screenings:    06/27/2022    3:44 PM 09/08/2021   10:07 AM  PHQ 2/9 Scores  PHQ - 2 Score 0 2  PHQ- 9 Score  4    Vision:Within last year, Dental: No current dental problems and Receives regular dental care, and STD: The patient denies history of sexually transmitted disease.    Patient Care Team: Samuel Bouche, NP as PCP - General (Nurse Practitioner)   Outpatient Medications Prior to Visit  Medication Sig   Calcium Carbonate-Vitamin D3 600-400 MG-UNIT TABS Take 1 tablet by mouth 2 (two) times daily.   docusate  sodium (COLACE) 100 MG capsule Take 100 mg by mouth 2 (two) times daily as needed for constipation.   meloxicam (MOBIC) 15 MG tablet One tab PO every 24 hours with a meal for 2 weeks, then once every 24 hours prn pain.   [DISCONTINUED] atorvastatin (LIPITOR) 10 MG tablet Take 1 tablet (10 mg total) by mouth daily.   [DISCONTINUED] venlafaxine (EFFEXOR) 75 MG tablet Take 1 tablet (75 mg total) by mouth 3 (three) times daily.   [DISCONTINUED] verapamil (CALAN-SR) 180 MG CR tablet TAKE 1 TABLET BY MOUTH AT BEDTIME.   busPIRone (BUSPAR) 15 MG tablet Take 0.5-1 tablets (7.5-15 mg total) by mouth 2 (two) times daily. (Patient not taking: Reported on 06/27/2022)   No facility-administered medications prior to visit.    Review of Systems  Constitutional:  Negative for chills, fever, malaise/fatigue and weight loss.  HENT:  Positive for tinnitus (Bilateral). Negative for congestion, ear pain, hearing loss, sinus pain and sore throat.   Eyes:  Negative for blurred vision, photophobia and pain.  Respiratory:  Negative for cough, shortness of breath and wheezing.   Cardiovascular:  Negative for chest pain, palpitations and leg swelling.  Gastrointestinal:  Negative for abdominal pain, constipation, diarrhea, heartburn, nausea and vomiting.  Genitourinary:  Negative for dysuria,  frequency and urgency.  Musculoskeletal:  Positive for neck pain (Sharp, stabbing, intermittent and, in the posterior neck that radiates into the back of the head). Negative for falls.  Skin:  Negative for itching and rash.  Neurological:  Negative for dizziness, weakness and headaches (On waking).  Endo/Heme/Allergies:  Negative for polydipsia. Does not bruise/bleed easily.  Psychiatric/Behavioral:  Negative for depression, substance abuse and suicidal ideas. The patient is not nervous/anxious.      Objective:    BP 127/71 (BP Location: Left Arm, Cuff Size: Normal)   Pulse 70   Resp 20   Ht 5' 4"$  (1.626 m)   Wt 139 lb 1.6 oz  (63.1 kg)   SpO2 99%   BMI 23.88 kg/m    Physical Exam Constitutional:      General: She is not in acute distress.    Appearance: Normal appearance. She is not ill-appearing.  HENT:     Head: Normocephalic and atraumatic.     Right Ear: Tympanic membrane, ear canal and external ear normal. There is no impacted cerumen.     Left Ear: Tympanic membrane, ear canal and external ear normal. There is no impacted cerumen.     Nose: Nose normal. No congestion or rhinorrhea.     Mouth/Throat:     Mouth: Mucous membranes are moist.     Pharynx: No oropharyngeal exudate or posterior oropharyngeal erythema.  Eyes:     General: No scleral icterus.       Right eye: No discharge.        Left eye: No discharge.     Extraocular Movements: Extraocular movements intact.     Conjunctiva/sclera: Conjunctivae normal.     Pupils: Pupils are equal, round, and reactive to light.  Neck:     Thyroid: No thyromegaly.     Vascular: No carotid bruit or JVD.     Trachea: Trachea normal.  Cardiovascular:     Rate and Rhythm: Normal rate and regular rhythm.     Pulses: Normal pulses.     Heart sounds: Normal heart sounds. No murmur heard.    No friction rub. No gallop.  Pulmonary:     Effort: Pulmonary effort is normal. No respiratory distress.     Breath sounds: Normal breath sounds. No wheezing.  Abdominal:     General: Bowel sounds are normal. There is no distension.     Palpations: Abdomen is soft.     Tenderness: There is no abdominal tenderness. There is no guarding.  Musculoskeletal:        General: Normal range of motion.     Cervical back: Normal range of motion and neck supple.  Lymphadenopathy:     Cervical: No cervical adenopathy.  Skin:    General: Skin is warm and dry.  Neurological:     Mental Status: She is alert and oriented to person, place, and time.     Cranial Nerves: No cranial nerve deficit.  Psychiatric:        Mood and Affect: Mood normal.        Behavior: Behavior  normal.        Thought Content: Thought content normal.        Judgment: Judgment normal.   No results found for any visits on 06/27/22.     Assessment & Plan:    Routine Health Maintenance and Physical Exam  Immunization History  Administered Date(s) Administered   Influenza,inj,Quad PF,6+ Mos 01/10/2018, 01/31/2019, 03/29/2020, 04/04/2022   Influenza,inj,quad, With Preservative 01/10/2018, 01/31/2019  Influenza-Unspecified 03/07/2021   Moderna Covid-19 Vaccine Bivalent Booster 59yr & up 05/06/2021   Moderna Sars-Covid-2 Vaccination 08/24/2019, 09/21/2019, 04/21/2020   Zoster Recombinat (Shingrix) 07/31/2019, 03/29/2020    Health Maintenance  Topic Date Due   DTaP/Tdap/Td (1 - Tdap) Never done   COVID-19 Vaccine (5 - 2023-24 season) 01/13/2022   PAP SMEAR-Modifier  01/30/2023   MAMMOGRAM  06/23/2023   COLONOSCOPY (Pts 45-481yrInsurance coverage will need to be confirmed)  03/16/2024   INFLUENZA VACCINE  Completed   Zoster Vaccines- Shingrix  Completed   HPV VACCINES  Aged Out   Hepatitis C Screening  Discontinued   HIV Screening  Discontinued    Discussed health benefits of physical activity, and encouraged her to engage in regular exercise appropriate for her age and condition.  1. Morning headache Discussed possible relation to sleep apnea versus cervicogenic headache, migraine, TMJ, bruxism, etc.  Will proceed with a home sleep study to evaluate any sleep apnea.  On her STOP-BANG criteria, she scores a 4 which is intermediate risk for OSA. - Home sleep test; Future  2. Annual physical exam Checking labs as below.  Up-to-date on preventative care.  Wellness information provided with AVS.   - CBC with Differential/Platelet - COMPLETE METABOLIC PANEL WITH GFR - Lipid panel  3. Hypertension goal BP (blood pressure) < 130/80 On review, her blood pressures have been near the borderline or somewhat over.  Suspect sodium intake is causing the elevation.  For now  continue verapamil 180 mg daily at bedtime.  Advised to monitor blood pressure at home with a goal of 130/80 or less.  If consistently running higher, we will need to adjust medications. - verapamil (CALAN-SR) 180 MG CR tablet; Take 1 tablet (180 mg total) by mouth at bedtime.  Dispense: 90 tablet; Refill: 3  4. Lipid screening 5. Mixed hyperlipidemia Checking lipid panel today.  Continue atorvastatin 10 mg daily. - atorvastatin (LIPITOR) 10 MG tablet; Take 1 tablet (10 mg total) by mouth daily.  Dispense: 90 tablet; Refill: 3  6. Anxiety disorder, unspecified type 7. Dysthymia Symptoms stable and well-controlled.  Continue venlafaxine 75 mg 3 times daily as prescribed. - venlafaxine (EFFEXOR) 75 MG tablet; Take 1 tablet (75 mg total) by mouth 3 (three) times daily.  Dispense: 270 tablet; Refill: 3  Return in about 6 months (around 12/26/2022) for chronic disease follow up.     JoSamuel BoucheNP

## 2022-06-28 ENCOUNTER — Encounter: Payer: Self-pay | Admitting: Medical-Surgical

## 2022-06-28 LAB — COMPLETE METABOLIC PANEL WITH GFR
AG Ratio: 1.8 (calc) (ref 1.0–2.5)
ALT: 15 U/L (ref 6–29)
AST: 14 U/L (ref 10–35)
Albumin: 4.4 g/dL (ref 3.6–5.1)
Alkaline phosphatase (APISO): 64 U/L (ref 37–153)
BUN: 16 mg/dL (ref 7–25)
CO2: 29 mmol/L (ref 20–32)
Calcium: 9.4 mg/dL (ref 8.6–10.4)
Chloride: 103 mmol/L (ref 98–110)
Creat: 0.73 mg/dL (ref 0.50–1.03)
Globulin: 2.4 g/dL (calc) (ref 1.9–3.7)
Glucose, Bld: 82 mg/dL (ref 65–99)
Potassium: 4.5 mmol/L (ref 3.5–5.3)
Sodium: 141 mmol/L (ref 135–146)
Total Bilirubin: 0.3 mg/dL (ref 0.2–1.2)
Total Protein: 6.8 g/dL (ref 6.1–8.1)
eGFR: 96 mL/min/{1.73_m2} (ref >=60)

## 2022-06-28 LAB — LIPID PANEL
Cholesterol: 263 mg/dL — ABNORMAL HIGH (ref <200)
HDL: 87 mg/dL (ref >=50)
LDL Cholesterol (Calc): 151 mg/dL (calc) — ABNORMAL HIGH
Non-HDL Cholesterol (Calc): 176 mg/dL (calc) — ABNORMAL HIGH (ref <130)
Total CHOL/HDL Ratio: 3 (calc) (ref <5.0)
Triglycerides: 125 mg/dL (ref <150)

## 2022-06-28 LAB — CBC WITH DIFFERENTIAL/PLATELET
Absolute Monocytes: 446 cells/uL (ref 200–950)
Basophils Absolute: 50 cells/uL (ref 0–200)
Basophils Relative: 0.9 %
Eosinophils Absolute: 61 cells/uL (ref 15–500)
Eosinophils Relative: 1.1 %
HCT: 40.4 % (ref 35.0–45.0)
Hemoglobin: 13.9 g/dL (ref 11.7–15.5)
Lymphs Abs: 1441 cells/uL (ref 850–3900)
MCH: 31.5 pg (ref 27.0–33.0)
MCHC: 34.4 g/dL (ref 32.0–36.0)
MCV: 91.6 fL (ref 80.0–100.0)
MPV: 12.8 fL — ABNORMAL HIGH (ref 7.5–12.5)
Monocytes Relative: 8.1 %
Neutro Abs: 3504 cells/uL (ref 1500–7800)
Neutrophils Relative %: 63.7 %
Platelets: 245 10*3/uL (ref 140–400)
RBC: 4.41 10*6/uL (ref 3.80–5.10)
RDW: 11.4 % (ref 11.0–15.0)
Total Lymphocyte: 26.2 %
WBC: 5.5 10*3/uL (ref 3.8–10.8)

## 2022-07-17 ENCOUNTER — Encounter (HOSPITAL_BASED_OUTPATIENT_CLINIC_OR_DEPARTMENT_OTHER): Payer: BC Managed Care – PPO | Admitting: Internal Medicine

## 2022-07-31 ENCOUNTER — Ambulatory Visit (HOSPITAL_BASED_OUTPATIENT_CLINIC_OR_DEPARTMENT_OTHER): Payer: BC Managed Care – PPO | Attending: Medical-Surgical | Admitting: Internal Medicine

## 2022-08-25 ENCOUNTER — Ambulatory Visit (HOSPITAL_BASED_OUTPATIENT_CLINIC_OR_DEPARTMENT_OTHER): Payer: BC Managed Care – PPO | Attending: Medical-Surgical | Admitting: Internal Medicine

## 2022-08-25 DIAGNOSIS — G4733 Obstructive sleep apnea (adult) (pediatric): Secondary | ICD-10-CM | POA: Insufficient documentation

## 2022-08-25 DIAGNOSIS — R519 Headache, unspecified: Secondary | ICD-10-CM | POA: Diagnosis not present

## 2022-09-02 DIAGNOSIS — R519 Headache, unspecified: Secondary | ICD-10-CM

## 2022-09-02 NOTE — Procedures (Signed)
    Patient Name: Charlene Bell, [place] Date: 08/25/2022 Gender: Female D.O.B: 1965/10/31 Age (years): 43 Referring Provider: Christen Butter NP Height (inches): 63 Interpreting Physician: Jetty Duhamel MD, ABSM Weight (lbs): 135 RPSGT: [place] BMI: 24 MRN: [id number] Neck Size: 13.75  CLINICAL INFORMATION Sleep Study Type: HST Indication for sleep study: Headache Epworth Sleepiness Score: 5  SLEEP STUDY TECHNIQUE A multi-channel overnight portable sleep study was performed. The channels recorded were: nasal airflow, thoracic respiratory movement, and oxygen saturation with a pulse oximetry. Snoring was also monitored.  MEDICATIONS Patient self administered medications include: none reported.  SLEEP ARCHITECTURE Patient was studied for 364.5 minutes. The sleep efficiency was 100.0 % and the patient was supine for 0%. The arousal index was 0.0 per hour.  RESPIRATORY PARAMETERS The overall AHI was 9.9 per hour, with a central apnea index of 0 per hour. The oxygen nadir was 88% during sleep.  CARDIAC DATA Mean heart rate during sleep was 65.4 bpm.  IMPRESSIONS - Mild obstructive sleep apnea occurred during this study (AHI = 9.9/h). - Mild oxygen desaturation was noted during this study (Min O2 = 88%, Mean 04%). - Patient snored.  DIAGNOSIS - Obstructive Sleep Apnea (G47.33)  RECOMMENDATIONS - Treatment for mild OSA is guided by symptoms and co-morbidity. If conservative measures are insufficient, consider CPAP titration sleep study, autopap or a fitted oral appliance. Other options would be baased on clinical judgment. - Be careful with alcohol, sedatives and other CNS depressants that may worsen sleep apnea and disrupt normal sleep architecture. - Sleep hygiene should be reviewed to assess factors that may improve sleep quality. - Weight management and regular exercise should be initiated or continued.  [Electronically signed] 09/02/2022 11:40 AM  Jetty Duhamel MD,  ABSM Diplomate, American Board of Sleep Medicine NPI: [id number]                         Jetty Duhamel Diplomate, American Board of Sleep Medicine  ELECTRONICALLY SIGNED ON:  09/02/2022, 11:36 AM [facility] PH: [phone]   FX: [phone] ACCREDITED BY THE AMERICAN ACADEMY OF SLEEP MEDICINE

## 2022-09-04 NOTE — Progress Notes (Signed)
Sent message through My Chart

## 2022-09-13 DIAGNOSIS — M9903 Segmental and somatic dysfunction of lumbar region: Secondary | ICD-10-CM | POA: Diagnosis not present

## 2022-09-13 DIAGNOSIS — M9905 Segmental and somatic dysfunction of pelvic region: Secondary | ICD-10-CM | POA: Diagnosis not present

## 2022-09-13 DIAGNOSIS — M9902 Segmental and somatic dysfunction of thoracic region: Secondary | ICD-10-CM | POA: Diagnosis not present

## 2022-09-13 DIAGNOSIS — M9901 Segmental and somatic dysfunction of cervical region: Secondary | ICD-10-CM | POA: Diagnosis not present

## 2022-09-15 DIAGNOSIS — M9902 Segmental and somatic dysfunction of thoracic region: Secondary | ICD-10-CM | POA: Diagnosis not present

## 2022-09-15 DIAGNOSIS — M9901 Segmental and somatic dysfunction of cervical region: Secondary | ICD-10-CM | POA: Diagnosis not present

## 2022-09-15 DIAGNOSIS — M9903 Segmental and somatic dysfunction of lumbar region: Secondary | ICD-10-CM | POA: Diagnosis not present

## 2022-09-15 DIAGNOSIS — M9905 Segmental and somatic dysfunction of pelvic region: Secondary | ICD-10-CM | POA: Diagnosis not present

## 2022-09-15 DIAGNOSIS — M9906 Segmental and somatic dysfunction of lower extremity: Secondary | ICD-10-CM | POA: Diagnosis not present

## 2022-09-20 DIAGNOSIS — M9901 Segmental and somatic dysfunction of cervical region: Secondary | ICD-10-CM | POA: Diagnosis not present

## 2022-09-20 DIAGNOSIS — M9905 Segmental and somatic dysfunction of pelvic region: Secondary | ICD-10-CM | POA: Diagnosis not present

## 2022-09-20 DIAGNOSIS — M9906 Segmental and somatic dysfunction of lower extremity: Secondary | ICD-10-CM | POA: Diagnosis not present

## 2022-09-20 DIAGNOSIS — M9902 Segmental and somatic dysfunction of thoracic region: Secondary | ICD-10-CM | POA: Diagnosis not present

## 2022-09-20 DIAGNOSIS — M9903 Segmental and somatic dysfunction of lumbar region: Secondary | ICD-10-CM | POA: Diagnosis not present

## 2022-09-26 DIAGNOSIS — M9905 Segmental and somatic dysfunction of pelvic region: Secondary | ICD-10-CM | POA: Diagnosis not present

## 2022-09-26 DIAGNOSIS — M9903 Segmental and somatic dysfunction of lumbar region: Secondary | ICD-10-CM | POA: Diagnosis not present

## 2022-09-26 DIAGNOSIS — M9906 Segmental and somatic dysfunction of lower extremity: Secondary | ICD-10-CM | POA: Diagnosis not present

## 2022-09-26 DIAGNOSIS — M9902 Segmental and somatic dysfunction of thoracic region: Secondary | ICD-10-CM | POA: Diagnosis not present

## 2022-09-26 DIAGNOSIS — M9901 Segmental and somatic dysfunction of cervical region: Secondary | ICD-10-CM | POA: Diagnosis not present

## 2022-10-03 DIAGNOSIS — M9905 Segmental and somatic dysfunction of pelvic region: Secondary | ICD-10-CM | POA: Diagnosis not present

## 2022-10-03 DIAGNOSIS — M9906 Segmental and somatic dysfunction of lower extremity: Secondary | ICD-10-CM | POA: Diagnosis not present

## 2022-10-03 DIAGNOSIS — M9901 Segmental and somatic dysfunction of cervical region: Secondary | ICD-10-CM | POA: Diagnosis not present

## 2022-10-03 DIAGNOSIS — M9902 Segmental and somatic dysfunction of thoracic region: Secondary | ICD-10-CM | POA: Diagnosis not present

## 2022-10-03 DIAGNOSIS — M9903 Segmental and somatic dysfunction of lumbar region: Secondary | ICD-10-CM | POA: Diagnosis not present

## 2022-10-11 DIAGNOSIS — M9901 Segmental and somatic dysfunction of cervical region: Secondary | ICD-10-CM | POA: Diagnosis not present

## 2022-10-11 DIAGNOSIS — M9902 Segmental and somatic dysfunction of thoracic region: Secondary | ICD-10-CM | POA: Diagnosis not present

## 2022-10-11 DIAGNOSIS — M9906 Segmental and somatic dysfunction of lower extremity: Secondary | ICD-10-CM | POA: Diagnosis not present

## 2022-10-11 DIAGNOSIS — M9905 Segmental and somatic dysfunction of pelvic region: Secondary | ICD-10-CM | POA: Diagnosis not present

## 2022-10-11 DIAGNOSIS — M9903 Segmental and somatic dysfunction of lumbar region: Secondary | ICD-10-CM | POA: Diagnosis not present

## 2022-10-18 DIAGNOSIS — M9906 Segmental and somatic dysfunction of lower extremity: Secondary | ICD-10-CM | POA: Diagnosis not present

## 2022-10-18 DIAGNOSIS — M9901 Segmental and somatic dysfunction of cervical region: Secondary | ICD-10-CM | POA: Diagnosis not present

## 2022-10-18 DIAGNOSIS — M9905 Segmental and somatic dysfunction of pelvic region: Secondary | ICD-10-CM | POA: Diagnosis not present

## 2022-10-18 DIAGNOSIS — M9903 Segmental and somatic dysfunction of lumbar region: Secondary | ICD-10-CM | POA: Diagnosis not present

## 2022-10-18 DIAGNOSIS — M9902 Segmental and somatic dysfunction of thoracic region: Secondary | ICD-10-CM | POA: Diagnosis not present

## 2022-10-20 NOTE — Progress Notes (Signed)
Patient advised.

## 2022-11-08 DIAGNOSIS — M9906 Segmental and somatic dysfunction of lower extremity: Secondary | ICD-10-CM | POA: Diagnosis not present

## 2022-11-08 DIAGNOSIS — M9901 Segmental and somatic dysfunction of cervical region: Secondary | ICD-10-CM | POA: Diagnosis not present

## 2022-11-08 DIAGNOSIS — M9905 Segmental and somatic dysfunction of pelvic region: Secondary | ICD-10-CM | POA: Diagnosis not present

## 2022-11-08 DIAGNOSIS — M9903 Segmental and somatic dysfunction of lumbar region: Secondary | ICD-10-CM | POA: Diagnosis not present

## 2022-11-08 DIAGNOSIS — M9902 Segmental and somatic dysfunction of thoracic region: Secondary | ICD-10-CM | POA: Diagnosis not present

## 2022-11-10 DIAGNOSIS — M9901 Segmental and somatic dysfunction of cervical region: Secondary | ICD-10-CM | POA: Diagnosis not present

## 2022-11-10 DIAGNOSIS — M9905 Segmental and somatic dysfunction of pelvic region: Secondary | ICD-10-CM | POA: Diagnosis not present

## 2022-11-10 DIAGNOSIS — M9903 Segmental and somatic dysfunction of lumbar region: Secondary | ICD-10-CM | POA: Diagnosis not present

## 2022-11-10 DIAGNOSIS — M9906 Segmental and somatic dysfunction of lower extremity: Secondary | ICD-10-CM | POA: Diagnosis not present

## 2022-11-10 DIAGNOSIS — M9902 Segmental and somatic dysfunction of thoracic region: Secondary | ICD-10-CM | POA: Diagnosis not present

## 2022-11-21 DIAGNOSIS — M9905 Segmental and somatic dysfunction of pelvic region: Secondary | ICD-10-CM | POA: Diagnosis not present

## 2022-11-21 DIAGNOSIS — M9902 Segmental and somatic dysfunction of thoracic region: Secondary | ICD-10-CM | POA: Diagnosis not present

## 2022-11-21 DIAGNOSIS — M9903 Segmental and somatic dysfunction of lumbar region: Secondary | ICD-10-CM | POA: Diagnosis not present

## 2022-11-21 DIAGNOSIS — M9901 Segmental and somatic dysfunction of cervical region: Secondary | ICD-10-CM | POA: Diagnosis not present

## 2022-12-05 DIAGNOSIS — M9901 Segmental and somatic dysfunction of cervical region: Secondary | ICD-10-CM | POA: Diagnosis not present

## 2022-12-05 DIAGNOSIS — M9903 Segmental and somatic dysfunction of lumbar region: Secondary | ICD-10-CM | POA: Diagnosis not present

## 2022-12-05 DIAGNOSIS — M9905 Segmental and somatic dysfunction of pelvic region: Secondary | ICD-10-CM | POA: Diagnosis not present

## 2022-12-05 DIAGNOSIS — M9906 Segmental and somatic dysfunction of lower extremity: Secondary | ICD-10-CM | POA: Diagnosis not present

## 2022-12-05 DIAGNOSIS — M9902 Segmental and somatic dysfunction of thoracic region: Secondary | ICD-10-CM | POA: Diagnosis not present

## 2022-12-18 ENCOUNTER — Other Ambulatory Visit: Payer: Self-pay | Admitting: Medical-Surgical

## 2022-12-18 DIAGNOSIS — Z1231 Encounter for screening mammogram for malignant neoplasm of breast: Secondary | ICD-10-CM

## 2022-12-28 ENCOUNTER — Ambulatory Visit (INDEPENDENT_AMBULATORY_CARE_PROVIDER_SITE_OTHER): Payer: BC Managed Care – PPO | Admitting: Medical-Surgical

## 2022-12-28 ENCOUNTER — Encounter: Payer: Self-pay | Admitting: Medical-Surgical

## 2022-12-28 VITALS — BP 119/69 | HR 73 | Ht 64.0 in | Wt 146.0 lb

## 2022-12-28 DIAGNOSIS — R5383 Other fatigue: Secondary | ICD-10-CM | POA: Diagnosis not present

## 2022-12-28 DIAGNOSIS — M7541 Impingement syndrome of right shoulder: Secondary | ICD-10-CM

## 2022-12-28 DIAGNOSIS — I10 Essential (primary) hypertension: Secondary | ICD-10-CM

## 2022-12-28 DIAGNOSIS — R635 Abnormal weight gain: Secondary | ICD-10-CM | POA: Diagnosis not present

## 2022-12-28 DIAGNOSIS — Z23 Encounter for immunization: Secondary | ICD-10-CM | POA: Diagnosis not present

## 2022-12-28 DIAGNOSIS — F419 Anxiety disorder, unspecified: Secondary | ICD-10-CM

## 2022-12-28 DIAGNOSIS — G43009 Migraine without aura, not intractable, without status migrainosus: Secondary | ICD-10-CM

## 2022-12-28 NOTE — Progress Notes (Signed)
        Established patient visit  History, exam, impression, and plan:  1. Hypertension goal BP (blood pressure) < 3/39 +21 57-year-old female presenting today with a history of hypertension currently managed with verapamil 180 mg daily.  Taking medication as prescribed, tolerating well without side effects or missed doses.  Checks her blood pressure at home intermittently and reports her readings have been at goal.  Denies concerning symptoms today.  Has started running for exercise and is now running a 5K every month.  Would like to aim for a 10K run a few months from now.  Cardiopulmonary exam normal today.  Checking labs as below.  Blood pressure at goal.  Continue verapamil as prescribed. - CBC with Differential/Platelet - Comprehensive metabolic panel  2. Anxiety disorder, unspecified type History of anxiety that is currently being managed with Effexor 75 mg 3 times daily.  Has been treated long-term on this and feels that the medication still works well for her.  Tried BuSpar for a short period of time however is not using this for anxiety and feels that her symptoms are well-controlled.  Mood, affect, thought pattern, speech, and cognition normal.  Continue Effexor as prescribed.  3. Impingement syndrome, shoulder, right Recent issues with right shoulder pain due to impingement syndrome.  She has meloxicam that she uses on as needed basis.  This seems to work well for her and her pain is well-controlled.  Continue Moxicaine as needed.  4. Migraine without aura and without status migrainosus, not intractable History of migraines and is currently on verapamil which does help with migraine prevention.  Has Imitrex 50 mg daily for migraine abortion.  Reports that her headaches have actually improved greatly over the last several months and she has not had a severe headache recently.  Continue verapamil and Imitrex as prescribed.  5. Fatigue, unspecified type Despite increasing exercise and  making lifestyle changes, she has had some profound fatigue lately in feels that she is tired all the time.  Has tried working on dietary modifications and looking at her daily regimen however has not been able to figure this out.  Does have a history of iron deficiency anemia and wonders if this may have returned.  Plan to check labs as below for further investigation. - CBC with Differential/Platelet - Comprehensive metabolic panel - TSH - Vitamin B12 - VITAMIN D 25 Hydroxy (Vit-D Deficiency, Fractures) - Hemoglobin A1c  6. Weight gain Notes about 10 pound weight gain despite her efforts at regular exercise.  Checking TSH and A1c today. - TSH - Hemoglobin A1c  7. Need for hepatitis A vaccination Will be traveling to [place] frequently over the next year or so and is interested in getting the hepatitis A vaccine.  Reviewed recommendations for vaccination and the 3 shot series.  She would like to start today so hepatitis A given IM x 1 today.  The next will be due at 6 months and then again at 18 months. - Hepatitis A vaccine adult IM   Procedures performed this visit: None.  Return in about 6 months (around 06/30/2023) for mood/BP/HA follow up .  __________________________________ Thayer Ohm, DNP, APRN, FNP-BC Primary Care and Sports Medicine [facility] [place] [facility]

## 2022-12-29 LAB — COMPREHENSIVE METABOLIC PANEL
ALT: 19 IU/L (ref 0–32)
AST: 19 IU/L (ref 0–40)
Albumin: 4.2 g/dL (ref 3.8–4.9)
Alkaline Phosphatase: 76 IU/L (ref 44–121)
BUN/Creatinine Ratio: 24 — ABNORMAL HIGH (ref 9–23)
BUN: 19 mg/dL (ref 6–24)
Bilirubin Total: <0.2 mg/dL (ref 0.0–1.2)
CO2: 22 mmol/L (ref 20–29)
Calcium: 9.2 mg/dL (ref 8.7–10.2)
Chloride: 101 mmol/L (ref 96–106)
Creatinine, Ser: 0.79 mg/dL (ref 0.57–1.00)
Globulin, Total: 2.3 g/dL (ref 1.5–4.5)
Glucose: 85 mg/dL (ref 70–99)
Potassium: 4.6 mmol/L (ref 3.5–5.2)
Sodium: 139 mmol/L (ref 134–144)
Total Protein: 6.5 g/dL (ref 6.0–8.5)
eGFR: 88 mL/min/{1.73_m2} (ref >59)

## 2022-12-29 LAB — CBC WITH DIFFERENTIAL/PLATELET
Basophils Absolute: 0 10*3/uL (ref 0.0–0.2)
Basos: 1 %
EOS (ABSOLUTE): 0.1 10*3/uL (ref 0.0–0.4)
Eos: 2 %
Hematocrit: 44.1 % (ref 34.0–46.6)
Hemoglobin: 14.1 g/dL (ref 11.1–15.9)
Immature Grans (Abs): 0 10*3/uL (ref 0.0–0.1)
Immature Granulocytes: 0 %
Lymphocytes Absolute: 1.9 10*3/uL (ref 0.7–3.1)
Lymphs: 31 %
MCH: 29.4 pg (ref 26.6–33.0)
MCHC: 32 g/dL (ref 31.5–35.7)
MCV: 92 fL (ref 79–97)
Monocytes Absolute: 0.5 10*3/uL (ref 0.1–0.9)
Monocytes: 8 %
Neutrophils Absolute: 3.5 10*3/uL (ref 1.4–7.0)
Neutrophils: 58 %
Platelets: 231 10*3/uL (ref 150–450)
RBC: 4.79 x10E6/uL (ref 3.77–5.28)
RDW: 12 % (ref 11.7–15.4)
WBC: 6 10*3/uL (ref 3.4–10.8)

## 2022-12-29 LAB — HEMOGLOBIN A1C
Est. average glucose Bld gHb Est-mCnc: 111 mg/dL
Hgb A1c MFr Bld: 5.5 % (ref 4.8–5.6)

## 2022-12-29 LAB — VITAMIN D 25 HYDROXY (VIT D DEFICIENCY, FRACTURES): Vit D, 25-Hydroxy: 48.6 ng/mL (ref 30.0–100.0)

## 2022-12-29 LAB — VITAMIN B12: Vitamin B-12: >2000 pg/mL — ABNORMAL HIGH (ref 232–1245)

## 2022-12-29 LAB — TSH: TSH: 0.726 u[IU]/mL (ref 0.450–4.500)

## 2023-01-05 DIAGNOSIS — M9903 Segmental and somatic dysfunction of lumbar region: Secondary | ICD-10-CM | POA: Diagnosis not present

## 2023-01-05 DIAGNOSIS — M9905 Segmental and somatic dysfunction of pelvic region: Secondary | ICD-10-CM | POA: Diagnosis not present

## 2023-01-05 DIAGNOSIS — M9901 Segmental and somatic dysfunction of cervical region: Secondary | ICD-10-CM | POA: Diagnosis not present

## 2023-01-05 DIAGNOSIS — M9906 Segmental and somatic dysfunction of lower extremity: Secondary | ICD-10-CM | POA: Diagnosis not present

## 2023-01-05 DIAGNOSIS — M9902 Segmental and somatic dysfunction of thoracic region: Secondary | ICD-10-CM | POA: Diagnosis not present

## 2023-01-24 ENCOUNTER — Ambulatory Visit (INDEPENDENT_AMBULATORY_CARE_PROVIDER_SITE_OTHER): Payer: BC Managed Care – PPO

## 2023-01-24 DIAGNOSIS — Z1231 Encounter for screening mammogram for malignant neoplasm of breast: Secondary | ICD-10-CM

## 2023-03-16 DIAGNOSIS — M654 Radial styloid tenosynovitis [de Quervain]: Secondary | ICD-10-CM | POA: Diagnosis not present

## 2023-03-16 DIAGNOSIS — M79645 Pain in left finger(s): Secondary | ICD-10-CM | POA: Diagnosis not present

## 2023-03-16 DIAGNOSIS — M720 Palmar fascial fibromatosis [Dupuytren]: Secondary | ICD-10-CM | POA: Diagnosis not present

## 2023-04-06 DIAGNOSIS — Z23 Encounter for immunization: Secondary | ICD-10-CM | POA: Diagnosis not present

## 2023-05-01 DIAGNOSIS — M720 Palmar fascial fibromatosis [Dupuytren]: Secondary | ICD-10-CM | POA: Diagnosis not present

## 2023-05-01 DIAGNOSIS — M24542 Contracture, left hand: Secondary | ICD-10-CM | POA: Diagnosis not present

## 2023-05-01 DIAGNOSIS — G8918 Other acute postprocedural pain: Secondary | ICD-10-CM | POA: Diagnosis not present

## 2023-05-07 DIAGNOSIS — M720 Palmar fascial fibromatosis [Dupuytren]: Secondary | ICD-10-CM | POA: Diagnosis not present

## 2023-05-09 ENCOUNTER — Other Ambulatory Visit: Payer: Self-pay | Admitting: Medical-Surgical

## 2023-05-09 DIAGNOSIS — E782 Mixed hyperlipidemia: Secondary | ICD-10-CM

## 2023-05-09 DIAGNOSIS — F341 Dysthymic disorder: Secondary | ICD-10-CM

## 2023-05-09 DIAGNOSIS — F419 Anxiety disorder, unspecified: Secondary | ICD-10-CM

## 2023-05-11 DIAGNOSIS — M720 Palmar fascial fibromatosis [Dupuytren]: Secondary | ICD-10-CM | POA: Diagnosis not present

## 2023-05-11 DIAGNOSIS — Z4789 Encounter for other orthopedic aftercare: Secondary | ICD-10-CM | POA: Diagnosis not present

## 2023-05-14 DIAGNOSIS — M654 Radial styloid tenosynovitis [de Quervain]: Secondary | ICD-10-CM | POA: Diagnosis not present

## 2023-05-14 DIAGNOSIS — M720 Palmar fascial fibromatosis [Dupuytren]: Secondary | ICD-10-CM | POA: Diagnosis not present

## 2023-05-18 DIAGNOSIS — M720 Palmar fascial fibromatosis [Dupuytren]: Secondary | ICD-10-CM | POA: Diagnosis not present

## 2023-05-18 DIAGNOSIS — Z4789 Encounter for other orthopedic aftercare: Secondary | ICD-10-CM | POA: Diagnosis not present

## 2023-06-01 DIAGNOSIS — Z4789 Encounter for other orthopedic aftercare: Secondary | ICD-10-CM | POA: Diagnosis not present

## 2023-06-01 DIAGNOSIS — M720 Palmar fascial fibromatosis [Dupuytren]: Secondary | ICD-10-CM | POA: Diagnosis not present

## 2023-06-15 DIAGNOSIS — Z4789 Encounter for other orthopedic aftercare: Secondary | ICD-10-CM | POA: Diagnosis not present

## 2023-06-15 DIAGNOSIS — M720 Palmar fascial fibromatosis [Dupuytren]: Secondary | ICD-10-CM | POA: Diagnosis not present

## 2023-06-29 DIAGNOSIS — M720 Palmar fascial fibromatosis [Dupuytren]: Secondary | ICD-10-CM | POA: Diagnosis not present

## 2023-06-29 DIAGNOSIS — Z4789 Encounter for other orthopedic aftercare: Secondary | ICD-10-CM | POA: Diagnosis not present

## 2023-07-03 ENCOUNTER — Ambulatory Visit: Payer: BC Managed Care – PPO | Admitting: Medical-Surgical

## 2023-07-13 ENCOUNTER — Ambulatory Visit: Payer: BC Managed Care – PPO | Admitting: Family Medicine

## 2023-07-27 ENCOUNTER — Ambulatory Visit: Payer: BC Managed Care – PPO | Admitting: Family Medicine

## 2023-07-27 ENCOUNTER — Encounter: Payer: Self-pay | Admitting: Family Medicine

## 2023-07-27 VITALS — BP 127/59 | HR 75 | Temp 98.1°F | Resp 18 | Ht 64.0 in | Wt 154.1 lb

## 2023-07-27 DIAGNOSIS — H9313 Tinnitus, bilateral: Secondary | ICD-10-CM | POA: Diagnosis not present

## 2023-07-27 DIAGNOSIS — R635 Abnormal weight gain: Secondary | ICD-10-CM | POA: Diagnosis not present

## 2023-07-27 DIAGNOSIS — Z23 Encounter for immunization: Secondary | ICD-10-CM

## 2023-07-27 DIAGNOSIS — I10 Essential (primary) hypertension: Secondary | ICD-10-CM | POA: Diagnosis not present

## 2023-07-27 DIAGNOSIS — E782 Mixed hyperlipidemia: Secondary | ICD-10-CM | POA: Diagnosis not present

## 2023-07-27 DIAGNOSIS — Z7689 Persons encountering health services in other specified circumstances: Secondary | ICD-10-CM

## 2023-07-27 MED ORDER — VERAPAMIL HCL ER 180 MG PO TBCR: 180.0000 mg | EXTENDED_RELEASE_TABLET | Freq: Every day | ORAL | 3 refills | Status: AC

## 2023-07-27 MED ORDER — ATORVASTATIN CALCIUM 10 MG PO TABS: 10.0000 mg | ORAL_TABLET | Freq: Every evening | ORAL | 3 refills | Status: DC

## 2023-07-27 NOTE — Progress Notes (Signed)
 New Patient Office Visit  Subjective    Patient ID: Charlene Bell, female    DOB: August 28, 1965  Age: 58 y.o. MRN: [id number]  CC:  Chief Complaint  Patient presents with   transfer of care    Patient is here to establish care with a new PCP, She states that she would like to discuss womens healthcare, including menopause and wanting to schedule a pap.     HPI Charlene Bell presents to establish care and transfer care from [facility] Christen Butter NP.  Menopause Pt reports she feels like she is constipated more. She has tried OTC probiotics that has helped. She also reports weight gain in a short period of time. She also reports she has hot flashes and night sweats. Her mood is stable. She does report snoring and had sleep study last April that showed mild OSA.   Ringing in ears She has had ringing in her ears for the last year. No ear pain.   She needs Hep A #2. She travels to [place] a lot for work. She has received first done in August 2024.   Needs refills on Atorvastatin and Verapamil for HLD and HTN. Blood pressure stable and at goal Lipid panel last done Feb 2024.  Pt due for CPE with pap.   Outpatient Encounter Medications as of 07/27/2023  Medication Sig   atorvastatin (LIPITOR) 10 MG tablet Take 1 tablet (10 mg total) by mouth daily.   Calcium Carbonate-Vit D-Min (CALCIUM 600+D PLUS MINERALS) 600-400 MG-UNIT TABS Take by mouth.   Calcium Carbonate-Vitamin D3 600-400 MG-UNIT TABS Take 1 tablet by mouth 2 (two) times daily.   [DISCONTINUED] busPIRone (BUSPAR) 15 MG tablet Take 0.5-1 tablets (7.5-15 mg total) by mouth 2 (two) times daily.   meloxicam (MOBIC) 15 MG tablet One tab PO every 24 hours with a meal for 2 weeks, then once every 24 hours prn pain.   venlafaxine (EFFEXOR) 75 MG tablet Take 1 tablet (75 mg total) by mouth 3 (three) times daily.   verapamil (CALAN-SR) 180 MG CR tablet Take 1 tablet (180 mg total) by mouth at bedtime.   [DISCONTINUED] docusate sodium (COLACE) 100 MG  capsule Take 100 mg by mouth 2 (two) times daily as needed for constipation.   [DISCONTINUED] SUMAtriptan (IMITREX) 50 MG tablet Take by mouth.   No facility-administered encounter medications on file as of 07/27/2023.    Past Medical History:  Diagnosis Date   Anxiety    Depression    Dupuytren's contracture of right hand    Hypertension    Iron deficiency anemia 04/15/2018   Left breast mass    Microcytic anemia 02/01/2018   Perimenopause     Past Surgical History:  Procedure Laterality Date   BREAST BIOPSY Left 2008   all findings benign    COLONOSCOPY  2015   normal per patient   HAND TENDON SURGERY Right 1997   HERNIA REPAIR  02/2015   REFRACTIVE SURGERY     TUBAL LIGATION     UPPER GASTROINTESTINAL ENDOSCOPY      Family History  Problem Relation Age of Onset   Dementia Sister    Aneurysm Sister    CVA Sister    Heart attack Mother    Colon cancer Father    Heart failure Father    Esophageal cancer Neg Hx    Rectal cancer Neg Hx    Stomach cancer Neg Hx     Social History   Socioeconomic History   Marital status:  Married    Spouse name: Not on file   Number of children: Not on file   Years of education: Not on file   Highest education level: Not on file  Occupational History   Not on file  Tobacco Use   Smoking status: Never   Smokeless tobacco: Never  Vaping Use   Vaping status: Never Used  Substance and Sexual Activity   Alcohol use: Not Currently    Alcohol/week: 14.0 - 21.0 standard drinks of alcohol    Types: 14 - 21 Standard drinks or equivalent per week    Comment: last drink 02/03/18   Drug use: Never   Sexual activity: Yes    Partners: Female    Birth control/protection: None  Other Topics Concern   Not on file  Social History Narrative   Not on file   Social Drivers of Health   Financial Resource Strain: Not on file  Food Insecurity: Not on file  Transportation Needs: Not on file  Physical Activity: Not on file  Stress: Not on  file  Social Connections: Not on file  Intimate Partner Violence: Not on file    Review of Systems  Constitutional:        Weight gain  HENT:  Positive for tinnitus.   Gastrointestinal:  Positive for constipation.  All other systems reviewed and are negative.      Objective    BP (!) 127/59   Pulse 75   Temp 98.1 F (36.7 C) (Oral)   Resp 18   Ht 5\' 4"  (1.626 m)   Wt 154 lb 1.6 oz (69.9 kg)   SpO2 100%   BMI 26.45 kg/m   Physical Exam Vitals and nursing note reviewed.  Constitutional:      Appearance: Normal appearance. She is normal weight.  HENT:     Head: Normocephalic and atraumatic.     Right Ear: Tympanic membrane, ear canal and external ear normal.     Left Ear: Tympanic membrane, ear canal and external ear normal.     Nose: Nose normal.     Mouth/Throat:     Mouth: Mucous membranes are moist.     Pharynx: Oropharynx is clear.  Eyes:     Conjunctiva/sclera: Conjunctivae normal.     Pupils: Pupils are equal, round, and reactive to light.  Cardiovascular:     Rate and Rhythm: Normal rate and regular rhythm.     Pulses: Normal pulses.     Heart sounds: Normal heart sounds.  Pulmonary:     Effort: Pulmonary effort is normal.     Breath sounds: Normal breath sounds.  Skin:    General: Skin is warm.     Capillary Refill: Capillary refill takes less than 2 seconds.  Neurological:     General: No focal deficit present.     Mental Status: She is alert and oriented to person, place, and time. Mental status is at baseline.  Psychiatric:        Mood and Affect: Mood normal.        Behavior: Behavior normal.        Thought Content: Thought content normal.        Judgment: Judgment normal.       Assessment & Plan:   Problem List Items Addressed This Visit       Cardiovascular and Mediastinum   Hypertension goal BP (blood pressure) < 130/80     Other   Mixed hyperlipidemia   Encounter to establish care with new  doctor  Hypertension goal BP (blood  pressure) < 130/80 -     Verapamil HCl ER; Take 1 tablet (180 mg total) by mouth daily.  Dispense: 90 tablet; Refill: 3  Mixed hyperlipidemia -     Atorvastatin Calcium; Take 1 tablet (10 mg total) by mouth at bedtime.  Dispense: 90 tablet; Refill: 3  Weight gain  Tinnitus of both ears  Need for hepatitis A vaccination -     Hepatitis A vaccine adult IM   Refilled chronic HTN and HLD medicine Verapamil 180 mg daily and Atorvastatin 10mg  nightly.  Due to weight gain and constipation, will screen thyroid during CPE. Discussed with pt could be from menopause. She's had sleep study that showed mild OSA. Tinnitus in ears, offered ENT referral. She denies caffeine use and NSAID use. She wants to monitor for now, Hep A #2 due today due to travel to [place] with work.  No follow-ups on file.   Suzan Slick, MD

## 2023-08-24 ENCOUNTER — Encounter: Payer: Self-pay | Admitting: Family Medicine

## 2023-08-24 ENCOUNTER — Ambulatory Visit (INDEPENDENT_AMBULATORY_CARE_PROVIDER_SITE_OTHER): Admitting: Family Medicine

## 2023-08-24 VITALS — BP 116/71 | HR 80 | Temp 97.6°F | Resp 18 | Ht 64.0 in | Wt 153.2 lb

## 2023-08-24 DIAGNOSIS — Z124 Encounter for screening for malignant neoplasm of cervix: Secondary | ICD-10-CM

## 2023-08-24 DIAGNOSIS — Z13220 Encounter for screening for lipoid disorders: Secondary | ICD-10-CM | POA: Diagnosis not present

## 2023-08-24 DIAGNOSIS — Z0000 Encounter for general adult medical examination without abnormal findings: Secondary | ICD-10-CM

## 2023-08-24 DIAGNOSIS — Z136 Encounter for screening for cardiovascular disorders: Secondary | ICD-10-CM

## 2023-08-24 DIAGNOSIS — R7302 Impaired glucose tolerance (oral): Secondary | ICD-10-CM

## 2023-08-24 DIAGNOSIS — Z1329 Encounter for screening for other suspected endocrine disorder: Secondary | ICD-10-CM

## 2023-08-24 NOTE — Progress Notes (Signed)
 Complete physical exam  Patient: Charlene Bell   DOB: Jan 17, 1966   58 y.o. Female  MRN: [id number]  Subjective:    Chief Complaint  Patient presents with   Annual Exam    Patient is here for annual physical and Pap smear. Patient is fasting this morning    Charlene Bell is a 58 y.o. female who presents today for a complete physical exam. She reports consuming a general diet.  Pt walks and does marathon running  She generally feels well. She reports sleeping well. She does not have additional problems to discuss today.    Most recent fall risk assessment:    12/28/2022    3:23 PM  Fall Risk   Falls in the past year? 0  Number falls in past yr: 0  Injury with Fall? 0  Risk for fall due to : No Fall Risks  Follow up Falls evaluation completed     Most recent depression screenings:    07/27/2023   10:21 AM 12/28/2022    3:23 PM  PHQ 2/9 Scores  PHQ - 2 Score 0 0  PHQ- 9 Score 2     Vision:Within last year  Patient Active Problem List   Diagnosis Date Noted   Impingement syndrome, shoulder, right 03/28/2022   Acute low back pain 09/07/2020   Plantar fascial fibromatosis 12/24/2019   Anxiety disorder 10/14/2018   Family history of cerebral aneurysm 10/02/2018   History of iron deficiency 10/02/2018   Low ferritin 10/02/2018   Hypertension goal BP (blood pressure) < 130/80 04/15/2018   Migraine without aura and without status migrainosus, not intractable 04/15/2018   Leukopenia 02/01/2018   Mixed hyperlipidemia 02/01/2018   Dysthymia 01/10/2018   Family history of colon cancer in father 01/10/2018   Dupuytren's contracture of right hand    Past Medical History:  Diagnosis Date   Anxiety    Depression    Dupuytren's contracture of right hand    Hypertension    Iron deficiency anemia 04/15/2018   Left breast mass    Microcytic anemia 02/01/2018   Perimenopause    Past Surgical History:  Procedure Laterality Date   BREAST BIOPSY Left 2008   all findings benign     COLONOSCOPY  2015   normal per patient   HAND TENDON SURGERY Right 1997   HERNIA REPAIR  02/2015   REFRACTIVE SURGERY     TUBAL LIGATION     UPPER GASTROINTESTINAL ENDOSCOPY     Social History   Socioeconomic History   Marital status: Married    Spouse name: Not on file   Number of children: Not on file   Years of education: Not on file   Highest education level: Not on file  Occupational History   Not on file  Tobacco Use   Smoking status: Never   Smokeless tobacco: Never  Vaping Use   Vaping status: Never Used  Substance and Sexual Activity   Alcohol use: Not Currently    Alcohol/week: 14.0 - 21.0 standard drinks of alcohol    Types: 14 - 21 Standard drinks or equivalent per week    Comment: last drink 02/03/18   Drug use: Never   Sexual activity: Yes    Partners: Female    Birth control/protection: None  Other Topics Concern   Not on file  Social History Narrative   Not on file   Social Drivers of Health   Financial Resource Strain: Not on file  Food Insecurity: Not on file  Transportation Needs: Not on file  Physical Activity: Not on file  Stress: Not on file  Social Connections: Not on file  Intimate Partner Violence: Not on file   Family History  Problem Relation Age of Onset   Dementia Sister    Aneurysm Sister    CVA Sister    Heart attack Mother    Colon cancer Father    Heart failure Father    Esophageal cancer Neg Hx    Rectal cancer Neg Hx    Stomach cancer Neg Hx    No Known Allergies    Patient Care Team: Suzan Slick, MD as PCP - General (Family Medicine)   Outpatient Medications Prior to Visit  Medication Sig   atorvastatin (LIPITOR) 10 MG tablet Take 1 tablet (10 mg total) by mouth at bedtime.   Calcium Carbonate-Vit D-Min (CALCIUM 600+D PLUS MINERALS) 600-400 MG-UNIT TABS Take by mouth.   Calcium Carbonate-Vitamin D3 600-400 MG-UNIT TABS Take 1 tablet by mouth 2 (two) times daily.   meloxicam (MOBIC) 15 MG tablet One tab PO  every 24 hours with a meal for 2 weeks, then once every 24 hours prn pain.   venlafaxine (EFFEXOR) 75 MG tablet Take 1 tablet (75 mg total) by mouth 3 (three) times daily.   verapamil (CALAN-SR) 180 MG CR tablet Take 1 tablet (180 mg total) by mouth daily.   No facility-administered medications prior to visit.    Review of Systems  All other systems reviewed and are negative.        Objective:     BP 116/71   Pulse 80   Temp 97.6 F (36.4 C) (Oral)   Resp 18   Ht 5\' 4"  (1.626 m)   Wt 153 lb 3.2 oz (69.5 kg)   SpO2 98%   BMI 26.30 kg/m  BP Readings from Last 3 Encounters:  08/24/23 116/71  07/27/23 (!) 127/59  12/28/22 119/69      Physical Exam Vitals and nursing note reviewed. Exam conducted with a chaperone present.  Constitutional:      Appearance: Normal appearance. She is normal weight.  HENT:     Head: Normocephalic and atraumatic.     Right Ear: Tympanic membrane, ear canal and external ear normal.     Left Ear: Tympanic membrane, ear canal and external ear normal.     Nose: Nose normal.     Mouth/Throat:     Mouth: Mucous membranes are moist.     Pharynx: Oropharynx is clear.  Eyes:     Conjunctiva/sclera: Conjunctivae normal.     Pupils: Pupils are equal, round, and reactive to light.  Cardiovascular:     Rate and Rhythm: Normal rate and regular rhythm.     Pulses: Normal pulses.     Heart sounds: Normal heart sounds.  Pulmonary:     Effort: Pulmonary effort is normal.     Breath sounds: Normal breath sounds.  Abdominal:     General: Abdomen is flat. Bowel sounds are normal.  Genitourinary:    General: Normal vulva.     Rectum: Normal.  Skin:    General: Skin is warm.     Capillary Refill: Capillary refill takes less than 2 seconds.  Neurological:     General: No focal deficit present.     Mental Status: She is alert and oriented to person, place, and time. Mental status is at baseline.  Psychiatric:        Mood and Affect: Mood normal.  Behavior: Behavior normal.        Thought Content: Thought content normal.        Judgment: Judgment normal.     No results found for any visits on 08/24/23. Last CBC Lab Results  Component Value Date   WBC 6.0 12/28/2022   HGB 14.1 12/28/2022   HCT 44.1 12/28/2022   MCV 92 12/28/2022   MCH 29.4 12/28/2022   RDW 12.0 12/28/2022   PLT 231 12/28/2022   Last metabolic panel Lab Results  Component Value Date   GLUCOSE 85 12/28/2022   NA 139 12/28/2022   K 4.6 12/28/2022   CL 101 12/28/2022   CO2 22 12/28/2022   BUN 19 12/28/2022   CREATININE 0.79 12/28/2022   EGFR 88 12/28/2022   CALCIUM 9.2 12/28/2022   PROT 6.5 12/28/2022   ALBUMIN 4.2 12/28/2022   LABGLOB 2.3 12/28/2022   BILITOT <0.2 12/28/2022   ALKPHOS 76 12/28/2022   AST 19 12/28/2022   ALT 19 12/28/2022   Last lipids Lab Results  Component Value Date   CHOL 263 (H) 06/27/2022   HDL 87 06/27/2022   LDLCALC 151 (H) 06/27/2022   TRIG 125 06/27/2022   CHOLHDL 3.0 06/27/2022   Last hemoglobin A1c Lab Results  Component Value Date   HGBA1C 5.5 12/28/2022        Assessment & Plan:    Routine Health Maintenance and Physical Exam  Immunization History  Administered Date(s) Administered   Hepatitis A, Adult 12/28/2022, 07/27/2023   Influenza Split 01/10/2018, 01/31/2019   Influenza, Quadrivalent, Recombinant, Inj, Pf 04/06/2023   Influenza,inj,Quad PF,6+ Mos 01/10/2018, 01/31/2019, 03/29/2020, 04/04/2022   Influenza,inj,quad, With Preservative 01/10/2018, 01/31/2019   Influenza,trivalent, recombinat, inj, PF 04/06/2023   Influenza-Unspecified 03/07/2021, 04/06/2023   Moderna Covid-19 Vaccine Bivalent Booster 15yrs & up 05/06/2021   Moderna Sars-Covid-2 Vaccination 08/24/2019, 09/21/2019, 04/21/2020   Zoster Recombinant(Shingrix) 07/31/2019, 03/29/2020    Health Maintenance  Topic Date Due   DTaP/Tdap/Td (1 - Tdap) Never done   COVID-19 Vaccine (5 - 2024-25 season) 01/14/2023   Cervical Cancer  Screening (HPV/Pap Cotest)  01/30/2023   INFLUENZA VACCINE  12/14/2023   Colonoscopy  03/16/2024   MAMMOGRAM  01/23/2025   Zoster Vaccines- Shingrix  Completed   HPV VACCINES  Aged Out   Meningococcal B Vaccine  Aged Out   Hepatitis C Screening  Discontinued   HIV Screening  Discontinued    Discussed health benefits of physical activity, and encouraged her to engage in regular exercise appropriate for her age and condition.  Problem List Items Addressed This Visit   None Visit Diagnoses       Annual physical exam    -  Primary     Encounter for lipid screening for cardiovascular disease       Relevant Orders   Lipid panel     Impaired glucose tolerance       Relevant Orders   CBC with Differential/Platelet   Comprehensive metabolic panel with GFR   Hemoglobin A1c     Screening for cervical cancer       Relevant Orders   IGP, Aptima HPV     Screening for thyroid disorder       Relevant Orders   TSH   T4, free      Return in about 6 months (around 02/23/2024) for Chronic condition follow up. Annual physical exam  Encounter for lipid screening for cardiovascular disease -     Lipid panel  Impaired glucose tolerance -  CBC with Differential/Platelet -     Comprehensive metabolic panel with GFR -     Hemoglobin A1c  Screening for cervical cancer -     IGP, Aptima HPV  Screening for thyroid disorder -     TSH -     T4, free   Screening labs with pap See in 6 months sooner prn Up to date with mammogram and colonoscopy.    Suzan Slick, MD

## 2023-08-25 LAB — CBC WITH DIFFERENTIAL/PLATELET
Basophils Absolute: 0 10*3/uL (ref 0.0–0.2)
Basos: 1 %
EOS (ABSOLUTE): 0.1 10*3/uL (ref 0.0–0.4)
Eos: 2 %
Hematocrit: 41.1 % (ref 34.0–46.6)
Hemoglobin: 13.2 g/dL (ref 11.1–15.9)
Immature Grans (Abs): 0 10*3/uL (ref 0.0–0.1)
Immature Granulocytes: 0 %
Lymphocytes Absolute: 1.2 10*3/uL (ref 0.7–3.1)
Lymphs: 27 %
MCH: 29.2 pg (ref 26.6–33.0)
MCHC: 32.1 g/dL (ref 31.5–35.7)
MCV: 91 fL (ref 79–97)
Monocytes Absolute: 0.4 10*3/uL (ref 0.1–0.9)
Monocytes: 9 %
Neutrophils Absolute: 2.7 10*3/uL (ref 1.4–7.0)
Neutrophils: 61 %
Platelets: 257 10*3/uL (ref 150–450)
RBC: 4.52 x10E6/uL (ref 3.77–5.28)
RDW: 11.9 % (ref 11.7–15.4)
WBC: 4.4 10*3/uL (ref 3.4–10.8)

## 2023-08-25 LAB — COMPREHENSIVE METABOLIC PANEL WITH GFR
ALT: 16 IU/L (ref 0–32)
AST: 17 IU/L (ref 0–40)
Albumin: 4.5 g/dL (ref 3.8–4.9)
Alkaline Phosphatase: 77 IU/L (ref 44–121)
BUN/Creatinine Ratio: 17 (ref 9–23)
BUN: 14 mg/dL (ref 6–24)
Bilirubin Total: 0.4 mg/dL (ref 0.0–1.2)
CO2: 22 mmol/L (ref 20–29)
Calcium: 9.7 mg/dL (ref 8.7–10.2)
Chloride: 102 mmol/L (ref 96–106)
Creatinine, Ser: 0.84 mg/dL (ref 0.57–1.00)
Globulin, Total: 2.2 g/dL (ref 1.5–4.5)
Glucose: 92 mg/dL (ref 70–99)
Potassium: 4.9 mmol/L (ref 3.5–5.2)
Sodium: 144 mmol/L (ref 134–144)
Total Protein: 6.7 g/dL (ref 6.0–8.5)
eGFR: 81 mL/min/{1.73_m2} (ref >59)

## 2023-08-25 LAB — LIPID PANEL
Chol/HDL Ratio: 3.1 ratio (ref 0.0–4.4)
Cholesterol, Total: 241 mg/dL — ABNORMAL HIGH (ref 100–199)
HDL: 78 mg/dL (ref >39)
LDL Chol Calc (NIH): 150 mg/dL — ABNORMAL HIGH (ref 0–99)
Triglycerides: 75 mg/dL (ref 0–149)
VLDL Cholesterol Cal: 13 mg/dL (ref 5–40)

## 2023-08-25 LAB — T4, FREE: Free T4: 1.07 ng/dL (ref 0.82–1.77)

## 2023-08-25 LAB — TSH: TSH: 0.86 u[IU]/mL (ref 0.450–4.500)

## 2023-08-25 LAB — HEMOGLOBIN A1C
Est. average glucose Bld gHb Est-mCnc: 108 mg/dL
Hgb A1c MFr Bld: 5.4 % (ref 4.8–5.6)

## 2023-08-27 ENCOUNTER — Encounter: Payer: Self-pay | Admitting: Family Medicine

## 2023-08-27 DIAGNOSIS — E782 Mixed hyperlipidemia: Secondary | ICD-10-CM

## 2023-08-27 MED ORDER — ATORVASTATIN CALCIUM 20 MG PO TABS: 20.0000 mg | ORAL_TABLET | Freq: Every evening | ORAL | 3 refills | Status: AC

## 2023-08-29 ENCOUNTER — Encounter: Payer: Self-pay | Admitting: Family Medicine

## 2023-08-29 LAB — IGP, APTIMA HPV
HPV Aptima: NEGATIVE
PAP Smear Comment: 0

## 2023-09-14 ENCOUNTER — Other Ambulatory Visit: Payer: Self-pay | Admitting: Family Medicine

## 2023-09-14 DIAGNOSIS — F419 Anxiety disorder, unspecified: Secondary | ICD-10-CM

## 2023-09-14 DIAGNOSIS — F341 Dysthymic disorder: Secondary | ICD-10-CM

## 2023-10-31 DIAGNOSIS — Z713 Dietary counseling and surveillance: Secondary | ICD-10-CM | POA: Diagnosis not present

## 2023-11-01 DIAGNOSIS — Z713 Dietary counseling and surveillance: Secondary | ICD-10-CM | POA: Diagnosis not present

## 2023-11-02 DIAGNOSIS — M79645 Pain in left finger(s): Secondary | ICD-10-CM | POA: Diagnosis not present

## 2023-11-02 DIAGNOSIS — M720 Palmar fascial fibromatosis [Dupuytren]: Secondary | ICD-10-CM | POA: Diagnosis not present

## 2023-11-13 DIAGNOSIS — M25642 Stiffness of left hand, not elsewhere classified: Secondary | ICD-10-CM | POA: Diagnosis not present

## 2023-11-21 DIAGNOSIS — M25642 Stiffness of left hand, not elsewhere classified: Secondary | ICD-10-CM | POA: Diagnosis not present

## 2023-11-28 DIAGNOSIS — M25642 Stiffness of left hand, not elsewhere classified: Secondary | ICD-10-CM | POA: Diagnosis not present

## 2023-12-04 DIAGNOSIS — M25642 Stiffness of left hand, not elsewhere classified: Secondary | ICD-10-CM | POA: Diagnosis not present

## 2023-12-05 DIAGNOSIS — Z713 Dietary counseling and surveillance: Secondary | ICD-10-CM | POA: Diagnosis not present

## 2023-12-27 DIAGNOSIS — M25642 Stiffness of left hand, not elsewhere classified: Secondary | ICD-10-CM | POA: Diagnosis not present

## 2023-12-31 DIAGNOSIS — Z713 Dietary counseling and surveillance: Secondary | ICD-10-CM | POA: Diagnosis not present

## 2024-01-03 DIAGNOSIS — M9905 Segmental and somatic dysfunction of pelvic region: Secondary | ICD-10-CM | POA: Diagnosis not present

## 2024-01-03 DIAGNOSIS — M9901 Segmental and somatic dysfunction of cervical region: Secondary | ICD-10-CM | POA: Diagnosis not present

## 2024-01-03 DIAGNOSIS — M9903 Segmental and somatic dysfunction of lumbar region: Secondary | ICD-10-CM | POA: Diagnosis not present

## 2024-01-03 DIAGNOSIS — M9902 Segmental and somatic dysfunction of thoracic region: Secondary | ICD-10-CM | POA: Diagnosis not present

## 2024-01-07 DIAGNOSIS — M9905 Segmental and somatic dysfunction of pelvic region: Secondary | ICD-10-CM | POA: Diagnosis not present

## 2024-01-07 DIAGNOSIS — M9902 Segmental and somatic dysfunction of thoracic region: Secondary | ICD-10-CM | POA: Diagnosis not present

## 2024-01-07 DIAGNOSIS — M9901 Segmental and somatic dysfunction of cervical region: Secondary | ICD-10-CM | POA: Diagnosis not present

## 2024-01-07 DIAGNOSIS — M9903 Segmental and somatic dysfunction of lumbar region: Secondary | ICD-10-CM | POA: Diagnosis not present

## 2024-01-15 ENCOUNTER — Encounter: Payer: Self-pay | Admitting: Sports Medicine

## 2024-01-22 DIAGNOSIS — G4709 Other insomnia: Secondary | ICD-10-CM | POA: Diagnosis not present

## 2024-01-22 DIAGNOSIS — N951 Menopausal and female climacteric states: Secondary | ICD-10-CM | POA: Diagnosis not present

## 2024-01-22 DIAGNOSIS — R5383 Other fatigue: Secondary | ICD-10-CM | POA: Diagnosis not present

## 2024-01-22 DIAGNOSIS — E785 Hyperlipidemia, unspecified: Secondary | ICD-10-CM | POA: Diagnosis not present

## 2024-02-01 DIAGNOSIS — Z713 Dietary counseling and surveillance: Secondary | ICD-10-CM | POA: Diagnosis not present

## 2024-02-05 DIAGNOSIS — R232 Flushing: Secondary | ICD-10-CM | POA: Diagnosis not present

## 2024-02-05 DIAGNOSIS — N951 Menopausal and female climacteric states: Secondary | ICD-10-CM | POA: Diagnosis not present

## 2024-02-05 DIAGNOSIS — G4709 Other insomnia: Secondary | ICD-10-CM | POA: Diagnosis not present

## 2024-02-05 DIAGNOSIS — R5383 Other fatigue: Secondary | ICD-10-CM | POA: Diagnosis not present

## 2024-02-22 ENCOUNTER — Ambulatory Visit: Admitting: Family Medicine

## 2024-02-22 DIAGNOSIS — M25552 Pain in left hip: Secondary | ICD-10-CM | POA: Diagnosis not present

## 2024-02-22 DIAGNOSIS — M9904 Segmental and somatic dysfunction of sacral region: Secondary | ICD-10-CM | POA: Diagnosis not present

## 2024-02-22 DIAGNOSIS — M25551 Pain in right hip: Secondary | ICD-10-CM | POA: Diagnosis not present

## 2024-02-22 DIAGNOSIS — M9905 Segmental and somatic dysfunction of pelvic region: Secondary | ICD-10-CM | POA: Diagnosis not present

## 2024-02-29 ENCOUNTER — Encounter: Payer: Self-pay | Admitting: Family Medicine

## 2024-02-29 ENCOUNTER — Ambulatory Visit: Admitting: Family Medicine

## 2024-02-29 VITALS — BP 132/69 | HR 82 | Temp 98.0°F | Ht 64.0 in | Wt 142.0 lb

## 2024-02-29 DIAGNOSIS — F419 Anxiety disorder, unspecified: Secondary | ICD-10-CM | POA: Diagnosis not present

## 2024-02-29 DIAGNOSIS — M25552 Pain in left hip: Secondary | ICD-10-CM | POA: Diagnosis not present

## 2024-02-29 DIAGNOSIS — M25551 Pain in right hip: Secondary | ICD-10-CM | POA: Diagnosis not present

## 2024-02-29 DIAGNOSIS — M9904 Segmental and somatic dysfunction of sacral region: Secondary | ICD-10-CM | POA: Diagnosis not present

## 2024-02-29 DIAGNOSIS — Z23 Encounter for immunization: Secondary | ICD-10-CM | POA: Diagnosis not present

## 2024-02-29 MED ORDER — VENLAFAXINE HCL 75 MG PO TABS: 75.0000 mg | ORAL_TABLET | Freq: Three times a day (TID) | ORAL | 1 refills | Status: AC

## 2024-02-29 NOTE — Assessment & Plan Note (Addendum)
 Taking Effexor  150 mg in the morning and 75 mg at night. Prescribed for TID, unable to stay compliant with three doses per day. Struggling with menopause and has been dealing with lack of motivation. Started HRT 2 weeks ago for menopausal symptoms. Shared decision making, will continue with current regimen as this may not be a good time to wean.  She will increase her exercise.  Referral placed for counseling. Follow-up in 6 months with PCP, sooner if anything changes.  PHQ-9: 6 GAD-7: 0

## 2024-02-29 NOTE — Progress Notes (Signed)
 Established Patient Office Visit  Subjective   Patient ID: Charlene Bell, female    DOB: 21-Apr-1966  Age: 58 y.o. MRN: [id number]  Chief Complaint  Patient presents with   Anxiety    6 month follow up    GAD: Taking Effexor  150 mg in the morning and 75 mg at night. Unable to be compliant to  three times per day.  Has been taking this for a long time.  GAD-7 score today: 0 PHQ-9: 6  Does not feel like doing anything. This started in January.  Recently started taking HRT for 2 weeks. Symptoms seem to be helping a little.       ROS    Objective:     BP 132/69 (BP Location: Left Arm, Patient Position: Sitting, Cuff Size: Normal)   Pulse 82   Temp 98 F (36.7 C) (Oral)   Ht 5' 4 (1.626 m)   Wt 142 lb (64.4 kg)   LMP 05/16/2019 Comment: tubes tied  SpO2 98%   BMI 24.37 kg/m    Physical Exam Vitals and nursing note reviewed.  Constitutional:      General: She is not in acute distress.    Appearance: Normal appearance.  Cardiovascular:     Rate and Rhythm: Normal rate and regular rhythm.     Heart sounds: Normal heart sounds.  Pulmonary:     Effort: Pulmonary effort is normal.     Breath sounds: Normal breath sounds.  Skin:    General: Skin is warm and dry.  Neurological:     General: No focal deficit present.     Mental Status: She is alert. Mental status is at baseline.  Psychiatric:        Mood and Affect: Mood normal.        Behavior: Behavior normal.        Thought Content: Thought content normal.        Judgment: Judgment normal.       02/29/2024    9:53 AM 07/27/2023   10:21 AM 12/28/2022    3:23 PM 06/27/2022    3:44 PM 10/11/2021   11:42 AM  Depression screen PHQ 2/9  Decreased Interest 2 0 0 0   Down, Depressed, Hopeless 0 0 0 0   PHQ - 2 Score 2 0 0 0   Altered sleeping 2 1     Tired, decreased energy 2 1     Change in appetite 0 0   0  Feeling bad or failure about yourself  0 0     Trouble concentrating 0 0   0  Moving slowly or  fidgety/restless 0 0     Suicidal thoughts 0 0     PHQ-9 Score 6 2     Difficult doing work/chores Somewhat difficult Not difficult at all         02/29/2024    9:54 AM 09/08/2021   10:08 AM 07/31/2019   10:54 AM 01/31/2019   11:13 AM  GAD 7 : Generalized Anxiety Score  Nervous, Anxious, on Edge 0 0 1 1  Control/stop worrying 0 0 0 0  Worry too much - different things 0 0 0 0  Trouble relaxing 0 0 0 0  Restless 0 0 0 0  Easily annoyed or irritable 0 0 1 1  Afraid - awful might happen 0 0 1 0  Total GAD 7 Score 0 0 3 2  Anxiety Difficulty Not difficult at all Not difficult at all  Not  difficult at all      No results found for any visits on 02/29/24.    The 10-year ASCVD risk score (Arnett DK, et al., 2019) is: 3.1%    Assessment & Plan:   Problem List Items Addressed This Visit     Anxiety disorder - Primary   Taking Effexor  150 mg in the morning and 75 mg at night. Prescribed for TID, unable to stay compliant with three doses per day. Struggling with menopause and has been dealing with lack of motivation. Started HRT 2 weeks ago for menopausal symptoms. Shared decision making, will continue with current regimen as this may not be a good time to wean.  She will increase her exercise.  Referral placed for counseling. Follow-up in 6 months with PCP, sooner if anything changes.  PHQ-9: 6 GAD-7: 0        Relevant Medications   venlafaxine  (EFFEXOR ) 75 MG tablet   Other Relevant Orders   Ambulatory referral to Psychology   Encounter for administration of vaccine   Relevant Orders   Flu vaccine trivalent PF, 6mos and older(Flulaval,Afluria,Fluarix,Fluzone) (Completed)  Agrees with plan of care discussed.  Questions answered.   Return in about 6 months (around 08/29/2024) for anxiety  med management .    Darice JONELLE Brownie, FNP

## 2024-03-03 DIAGNOSIS — M9904 Segmental and somatic dysfunction of sacral region: Secondary | ICD-10-CM | POA: Diagnosis not present

## 2024-03-03 DIAGNOSIS — M25551 Pain in right hip: Secondary | ICD-10-CM | POA: Diagnosis not present

## 2024-03-03 DIAGNOSIS — M9905 Segmental and somatic dysfunction of pelvic region: Secondary | ICD-10-CM | POA: Diagnosis not present

## 2024-03-03 DIAGNOSIS — M25552 Pain in left hip: Secondary | ICD-10-CM | POA: Diagnosis not present

## 2024-03-05 DIAGNOSIS — M9905 Segmental and somatic dysfunction of pelvic region: Secondary | ICD-10-CM | POA: Diagnosis not present

## 2024-03-05 DIAGNOSIS — M25552 Pain in left hip: Secondary | ICD-10-CM | POA: Diagnosis not present

## 2024-03-05 DIAGNOSIS — M25551 Pain in right hip: Secondary | ICD-10-CM | POA: Diagnosis not present

## 2024-03-05 DIAGNOSIS — M9904 Segmental and somatic dysfunction of sacral region: Secondary | ICD-10-CM | POA: Diagnosis not present

## 2024-03-10 DIAGNOSIS — M25552 Pain in left hip: Secondary | ICD-10-CM | POA: Diagnosis not present

## 2024-03-10 DIAGNOSIS — M9905 Segmental and somatic dysfunction of pelvic region: Secondary | ICD-10-CM | POA: Diagnosis not present

## 2024-03-10 DIAGNOSIS — M9904 Segmental and somatic dysfunction of sacral region: Secondary | ICD-10-CM | POA: Diagnosis not present

## 2024-03-10 DIAGNOSIS — M25551 Pain in right hip: Secondary | ICD-10-CM | POA: Diagnosis not present

## 2024-03-14 DIAGNOSIS — M9904 Segmental and somatic dysfunction of sacral region: Secondary | ICD-10-CM | POA: Diagnosis not present

## 2024-03-14 DIAGNOSIS — M9905 Segmental and somatic dysfunction of pelvic region: Secondary | ICD-10-CM | POA: Diagnosis not present

## 2024-03-14 DIAGNOSIS — M25552 Pain in left hip: Secondary | ICD-10-CM | POA: Diagnosis not present

## 2024-03-14 DIAGNOSIS — M25551 Pain in right hip: Secondary | ICD-10-CM | POA: Diagnosis not present

## 2024-03-17 DIAGNOSIS — M9904 Segmental and somatic dysfunction of sacral region: Secondary | ICD-10-CM | POA: Diagnosis not present

## 2024-03-17 DIAGNOSIS — M9905 Segmental and somatic dysfunction of pelvic region: Secondary | ICD-10-CM | POA: Diagnosis not present

## 2024-03-17 DIAGNOSIS — M25552 Pain in left hip: Secondary | ICD-10-CM | POA: Diagnosis not present

## 2024-03-17 DIAGNOSIS — M25551 Pain in right hip: Secondary | ICD-10-CM | POA: Diagnosis not present

## 2024-04-03 ENCOUNTER — Ambulatory Visit: Admitting: Psychology

## 2024-04-03 DIAGNOSIS — R5383 Other fatigue: Secondary | ICD-10-CM | POA: Diagnosis not present

## 2024-04-03 DIAGNOSIS — R232 Flushing: Secondary | ICD-10-CM | POA: Diagnosis not present

## 2024-04-03 DIAGNOSIS — G4709 Other insomnia: Secondary | ICD-10-CM | POA: Diagnosis not present

## 2024-04-03 DIAGNOSIS — N951 Menopausal and female climacteric states: Secondary | ICD-10-CM | POA: Diagnosis not present

## 2024-04-08 DIAGNOSIS — R232 Flushing: Secondary | ICD-10-CM | POA: Diagnosis not present

## 2024-04-08 DIAGNOSIS — N951 Menopausal and female climacteric states: Secondary | ICD-10-CM | POA: Diagnosis not present

## 2024-04-08 DIAGNOSIS — R5383 Other fatigue: Secondary | ICD-10-CM | POA: Diagnosis not present

## 2024-04-08 DIAGNOSIS — G4709 Other insomnia: Secondary | ICD-10-CM | POA: Diagnosis not present

## 2024-04-30 DIAGNOSIS — Z713 Dietary counseling and surveillance: Secondary | ICD-10-CM | POA: Diagnosis not present

## 2024-05-01 ENCOUNTER — Ambulatory Visit (INDEPENDENT_AMBULATORY_CARE_PROVIDER_SITE_OTHER): Admitting: Psychology

## 2024-05-01 DIAGNOSIS — F4321 Adjustment disorder with depressed mood: Secondary | ICD-10-CM | POA: Diagnosis not present

## 2024-05-01 NOTE — Progress Notes (Signed)
 Behavioral Health Treatment Plan   Name:Charlene Bell   MRN: [id number]   Treatment Plan Development Date: 05/01/24   Strengths: Friends, Self Advocate, Able to [employer], and enjoys exercising, part of run club.  Supports: Spouse and Friends   Theatre Manager of Needs: being able to get coping skills for new adjustment in my life.   Treatment Level:outpatient cousneling  Client Treatment Preferences: pt prefers in person, monthly appointments.   Diagnosis: Adjustment Disorder  Adjustment w/ depressed mood  Symptoms:  Increased worry, stress, sadness about becoming empty nester  Goals:  Improve coping skills by learning effective ways to adapt and manage through stress and emotions w/ changes.   Objectives: Target Date For All Objectives: 05/01/25  Enhance problem-solving abilities and develop skills to address and resolve challenges., Promote positive behavior change and encourage healthy and productive activities., Interrupt negative thinking and increase positive thoughts and feelings, Build resilience and learn to adapt to stress and hardships., Stay connected with supportive friends and loved ones, and Practice mindfulness  Progress Documentation:   Progressing  Interventions:  Cognitive Behavioral Therapy, Assertiveness/Communication, Mindfulness Meditation, Solution-Oriented/Positive Psychology, Insight-Oriented, and supportive     Expected duration of treatment: 1 year and then reevaluate  Party responsible for implementation of interventions: pt and counselor.   This plan has been reviewed and created by the following participants: pt and counselor   A new plan will be created at least every 12 months.  The patient fully participated in the development of treatment plan with the clinician and verbally consents to such treatment.   Patient Treatment Plan Signature Obtained: Verbal consent provided and electronic signature  requested   BARBARANN APPL LCMHC                [place], LCMHC

## 2024-05-01 NOTE — Progress Notes (Signed)
 [facility] Counselor Initial Adult Exam  Name: Charlene Bell Date: 05/01/2024 MRN: [id number] DOB: May 16, 1965 PCP: Colette Torrence GRADE, MD  Time spent: 9:00am-9:55am  pt  is seen for an in person visit.  Guardian/Payee:  self    Paperwork requested: No   Reason for Visit /Presenting Problem: Pt is referred by PCP after discussing potential of coming off antidepressant. Pt reports dealing w/ some stress of upcoming empty nester.  Pt reports that has some loss of energy, some feeling down and some loss of interest.  Pt denies anxiety.  Pt reports started on antidepressants after the birth of her 2nd son who was born premature.  Pt reports also had death of her first husband the 2 years before and was a time of a lot of transition for her.  Pt seeking support in coping through upcoming change w/ her son graduating high school and plans to move away over summer.  Mental Status Exam: Appearance:   Well Groomed     Behavior:  Appropriate  Motor:  Normal  Speech/Language:   Clear and Coherent and Normal Rate  Affect:  Appropriate  Mood:  sad  Thought process:  normal  Thought content:    WNL  Sensory/Perceptual disturbances:    WNL  Orientation:  oriented to person, place, time/date, and situation  Attention:  Good  Concentration:  Good  Memory:  WNL  Fund of knowledge:   Good  Insight:    Good  Judgment:   Good  Impulse Control:  Good   Reported Symptoms:  Pt reports some days feeling down, some days loss of interest, some days of fatigue.  Pt reports her sleep is improved since starting on hormone therapy.  Pt reports that she is anticipating difficulty of transition w/ her youngest son graduating and moving out. Pt reports she is being mindful of being positive of transition.  Pt reflects on positive of belonging to exercise group and was more motivated when did this.  Pt plans to rejoin this week.  Pt reports some noticing in menopause more hesitant about her work travel to  [place]- thoughts/fear for safety that didn't experience in past.  Pt reports she has been able to cope through this.  Pt scored a 0 on GAD 7 and a 4 on PHQ9.    Risk Assessment: Danger to Self:  No Self-injurious Behavior: No Danger to Others: No Duty to Warn:no Physical Aggression / Violence:No  Access to Firearms a concern: No  Gang Involvement:No  Patient / guardian was educated about steps to take if suicide or homicide risk level increases between visits: no While future psychiatric events cannot be accurately predicted, the patient does not currently require acute inpatient psychiatric care and does not currently meet [facility]  involuntary commitment criteria.  Substance Abuse History: Current substance abuse: No     Past Psychiatric History:   Previous psychological history is significant for depression Outpatient Providers:no hx of previous cousneling. History of Psych Hospitalization: No  Psychological Testing: none   Abuse History:  Victim of: No., none   Report needed: No. Victim of Neglect:No. Perpetrator of none  Witness / Exposure to Domestic Violence: No   Protective Services Involvement: No  Witness to [employer] Violence:  No   Family History:  Family History  Problem Relation Age of Onset   Dementia Sister    Aneurysm Sister    CVA Sister    Heart attack Mother    Colon cancer Father    Heart  failure Father    Esophageal cancer Neg Hx    Rectal cancer Neg Hx    Stomach cancer Neg Hx   Pt grew up in [place].  Pt was youngest of 15 w/ older siblings close in age and she was 6 years younger than closest sibling.  Pt reports her parents are deceased.  Pt reports her oldest sister who was 20 years older is deceased.  Pt is close to her 2nd oldest sister.  Pt reports visits siblings about 1x a year when return to [place].  Pt reports siblings have never come to visit her.  Living situation: the patient lives with her family- husband and 13 y/o son in [place].  Have lived her 6.5 years ago from [place]- transfer w/  [place]  Sexual Orientation: Straight  Relationship Status: married.  Pt has been married 2x.  Pt first marriage ended w/ her husband's death from colen cancer in 11-May-2005.  Met current husband that year and have been together for 20 years.   Name of spouse / other:Daniel If a parent, number of children / ages: 3 sons- step son Tanner 27y/o lived w/ them growing up.  He currently lives in [place].  Her son w/ first husband Augustin 24y/o lives in [place], [place] and son w/ current husband Jonette 18y/o graduating high school and plans to work at [employer] beginning this summer.  Pt reports close w/her sons and positive relationship w/ her husband.   Support Systems: spouse friends  Financial Stress:  Yes - some as her husband lost his job Oct 2025 and trying to ger his [employer] business back to full time status.  Pt income only current consistent.    Income/Employment/Disability: Employment.  Pt works for [employer] as a Public Affairs Consultant.  Pt enjoys her job.  Pt does have some concern as talking about making organizational changes in new year and pushing for everyone to come in to office to work.  Pt currently has positive relationship w/her managers and flexibility and works usually 3 days remote.  Pt reports down side of her job is travel- which can be almost every week at times and then not for months.    Military Service: No   Educational History: Education: Risk Manager: Not reported   Any cultural differences that may affect / interfere with treatment:  not applicable   Recreation/Hobbies: enjoys working out.  Pt reports was positive when had a membership at [employer].  Pt and husband part of run club.  Enjoys 5K.    Stressors: Other: changes, empty nest in June 2026.      Strengths: Supportive Relationships, Self Advocate, and Able to Communicate Effectively  Barriers:  none   Legal History: Pending  legal issue / charges: The patient has no significant history of legal issues. History of legal issue / charges: none  Medical History/Surgical History: reviewed Past Medical History:  Diagnosis Date   Anxiety    Depression    Dupuytren's contracture of right hand    Hypertension    Iron deficiency anemia 04/15/2018   Left breast mass    Microcytic anemia 02/01/2018   Perimenopause     Past Surgical History:  Procedure Laterality Date   BREAST BIOPSY Left 05/12/2007   all findings benign    COLONOSCOPY  2015   normal per patient   HAND TENDON SURGERY Right 1997   HERNIA REPAIR  02/2015   REFRACTIVE SURGERY     TUBAL LIGATION  UPPER GASTROINTESTINAL ENDOSCOPY      Medications: Current Outpatient Medications  Medication Sig Dispense Refill   atorvastatin  (LIPITOR) 20 MG tablet Take 1 tablet (20 mg total) by mouth at bedtime. 90 tablet 3   Calcium  Carbonate-Vit D-Min (CALCIUM  600+D PLUS MINERALS) 600-400 MG-UNIT TABS Take by mouth. (Patient not taking: Reported on 02/29/2024)     Calcium  Carbonate-Vitamin D3 600-400 MG-UNIT TABS Take 1 tablet by mouth 2 (two) times daily. (Patient not taking: Reported on 02/29/2024)     meloxicam  (MOBIC ) 15 MG tablet One tab PO every 24 hours with a meal for 2 weeks, then once every 24 hours prn pain. 30 tablet 3   venlafaxine  (EFFEXOR ) 75 MG tablet Take 1 tablet (75 mg total) by mouth 3 (three) times daily. 270 tablet 1   verapamil  (CALAN -SR) 180 MG CR tablet Take 1 tablet (180 mg total) by mouth daily. 90 tablet 3   No current facility-administered medications for this visit.    Allergies[1]  Diagnoses:  Adjustment disorder with depressed mood  Plan of Care: Pt is a 58y/o female seeking counseling as referred by her PCP as discussed potential of stopping antidepressant.  Pt reports some down days, some loss of interest and fatigue.  Pt feels a lot of current emotions related to menopause and situational.  Pt reports she some worry w/  transition to being empty nester as youngest graduates and plans to work in [place] starting the summer.  Pt feels that would be beneficial to learn coping skills and have support through transition.  Pt to f/u w/ monthly counseling.  Pt to f/u as scheduled w/ PCP and plans to seek guidance from PCP re: reducing medication dose.     Franklin Baumbach, [employer]        [1] No Known Allergies

## 2024-05-01 NOTE — Progress Notes (Signed)
   Charlene Bell, [facility]

## 2024-05-19 ENCOUNTER — Encounter: Payer: Self-pay | Admitting: Psychology

## 2024-06-05 ENCOUNTER — Ambulatory Visit: Admitting: Psychology

## 2024-08-29 ENCOUNTER — Ambulatory Visit: Admitting: Family Medicine
